# Patient Record
Sex: Female | Born: 1975 | Race: Black or African American | Hispanic: No | Marital: Married | Smoking: Never smoker
Health system: Southern US, Community
[De-identification: ages and names within clinical notes are randomized; demographics above are authoritative.]

## PROBLEM LIST (undated history)

## (undated) DIAGNOSIS — M8580 Other specified disorders of bone density and structure, unspecified site: Secondary | ICD-10-CM

## (undated) DIAGNOSIS — E559 Vitamin D deficiency, unspecified: Secondary | ICD-10-CM

## (undated) DIAGNOSIS — D34 Benign neoplasm of thyroid gland: Secondary | ICD-10-CM

## (undated) DIAGNOSIS — D509 Iron deficiency anemia, unspecified: Secondary | ICD-10-CM

## (undated) DIAGNOSIS — I2542 Coronary artery dissection: Secondary | ICD-10-CM

## (undated) DIAGNOSIS — E079 Disorder of thyroid, unspecified: Secondary | ICD-10-CM

## (undated) DIAGNOSIS — N6009 Solitary cyst of unspecified breast: Secondary | ICD-10-CM

## (undated) HISTORY — DX: Disorder of thyroid, unspecified: E07.9

## (undated) HISTORY — DX: Benign neoplasm of thyroid gland: D34

## (undated) HISTORY — DX: Vitamin D deficiency, unspecified: E55.9

## (undated) HISTORY — DX: Other specified disorders of bone density and structure, unspecified site: M85.80

## (undated) HISTORY — DX: Solitary cyst of unspecified breast: N60.09

## (undated) HISTORY — PX: BREAST SURGERY: SHX581

---

## 2008-07-20 ENCOUNTER — Ambulatory Visit: Payer: Self-pay | Admitting: Family Medicine

## 2012-03-13 LAB — HM COLONOSCOPY

## 2012-07-31 DIAGNOSIS — N6009 Solitary cyst of unspecified breast: Secondary | ICD-10-CM

## 2012-07-31 HISTORY — DX: Solitary cyst of unspecified breast: N60.09

## 2013-01-07 ENCOUNTER — Ambulatory Visit: Payer: Self-pay | Admitting: Family Medicine

## 2013-02-04 ENCOUNTER — Ambulatory Visit (INDEPENDENT_AMBULATORY_CARE_PROVIDER_SITE_OTHER): Payer: 59 | Admitting: General Surgery

## 2013-02-04 ENCOUNTER — Encounter: Payer: Self-pay | Admitting: General Surgery

## 2013-02-04 VITALS — BP 118/80 | HR 82 | Resp 14 | Ht 70.0 in | Wt 148.0 lb

## 2013-02-04 DIAGNOSIS — N6009 Solitary cyst of unspecified breast: Secondary | ICD-10-CM

## 2013-02-04 DIAGNOSIS — N6001 Solitary cyst of right breast: Secondary | ICD-10-CM

## 2013-02-04 NOTE — Progress Notes (Signed)
aPatient ID: Stefanie [name], female   DOB: 22-Feb-1976, 37 y.o.   MRN: [id number]  Chief Complaint  Patient presents with  . Breast Problem    Evaluate breast cyst    HPI Stefanie [name] is a 37 y.o. female who presents for a breast evaluation. The patient complains of a right breast lumps. The patient states she has 2 lumps located in the right lower inner quadrant and 2 lumps in the right upper outer quadrant of the right breast. Both areas are tender to the touch. She states she noticed these lumps right after her miscarriage at [redacted] weeks gestation which occurred in April 2014. The most recent mammogram was done on 01/07/13 with a birad category 2. A right breast ultrasound was done at that time. No personal or family history of breast cancer.   HPI  Past Medical History  Diagnosis Date  . Thyroid disease     hypothyroidism  . Osteopenia   . Breast cyst 2014    History reviewed. No pertinent past surgical history.  History reviewed. No pertinent family history.  Social History History  Substance Use Topics  . Smoking status: Never Smoker   . Smokeless tobacco: Never Used  . Alcohol Use: No    No Known Allergies  Current Outpatient Prescriptions  Medication Sig Dispense Refill  . Alendronate Sodium (BINOSTO) 70 MG TBEF Take 1 tablet by mouth once a week.      . levothyroxine (SYNTHROID, LEVOTHROID) 125 MCG tablet Take 125 mcg by mouth daily.      . Prenatal Vit-Fe Fumarate-FA (PRENATAL MULTIVITAMIN) TABS Take 1 tablet by mouth daily at 12 noon.       No current facility-administered medications for this visit.    Review of Systems Review of Systems  Constitutional: Negative.   Respiratory: Negative.   Cardiovascular: Negative.     Blood pressure 118/80, pulse 82, resp. rate 14, height 5\' 10"  (1.778 m), weight 148 lb (67.132 kg), last menstrual period 01/11/2013.  Physical Exam Physical Exam  Constitutional: She is oriented to person, place, and time. She appears  well-developed and well-nourished.  Neck: No thyromegaly present.  Cardiovascular: Normal rate, regular rhythm and normal heart sounds.   No murmur heard. Pulmonary/Chest: Effort normal and breath sounds normal. Right breast exhibits mass. Right breast exhibits no inverted nipple, no nipple discharge, no skin change and no tenderness. Left breast exhibits no inverted nipple, no mass, no nipple discharge, no skin change and no tenderness. Breasts are symmetrical.  Transverse crease on both nipples.    4 o'clock 1.5cm right breast 5 o'clock 2.5cm right breast  Upper outer quadrant multiple cysts up to 1.5cm of right breast.    Lymphadenopathy:    She has no cervical adenopathy.    She has no axillary adenopathy.  Neurological: She is alert and oriented to person, place, and time.  Skin: Skin is warm and dry.    Data Reviewed January 07, 2013 mammogram showed multiple densities throughout the right breast. Ultrasound completed the same date showed multiple cystic densities. BI-RAD-2.  Assessment    Asymptomatic breast cysts likely related to recent miscarriage.     Plan    The patient is presently pain free. If the lesions in the right breast enlarge, become tender or worrisome, aspiration can be undertaken. As they developed after her recent pregnancy/miscarriage, I would anticipate eventual spontaneous resolution.       Earline Mayotte 02/04/2013, 8:14 PM

## 2013-02-04 NOTE — Patient Instructions (Signed)
Patient to return as needed. She is advised to call if any problems arise.

## 2013-07-17 IMAGING — US ULTRASOUND RIGHT BREAST
1 series · 14 of 25 positions shown · non-contrast
Comparison: none

REASON FOR EXAM: RT BRST LUMP 11 OCLOCK  AND 3 TO HVOMVOP AND BASELINE
COMMENTS:

PROCEDURE:     US  - US BREAST RIGHT  - January 07, 2013  [DATE]
RESULT:     History: Right breast lump.
Comparison Study: No prior.

[Series 1: ultrasound right breast · 0.08mm/px · 14 of 60 slices shown]
[im 1/60]
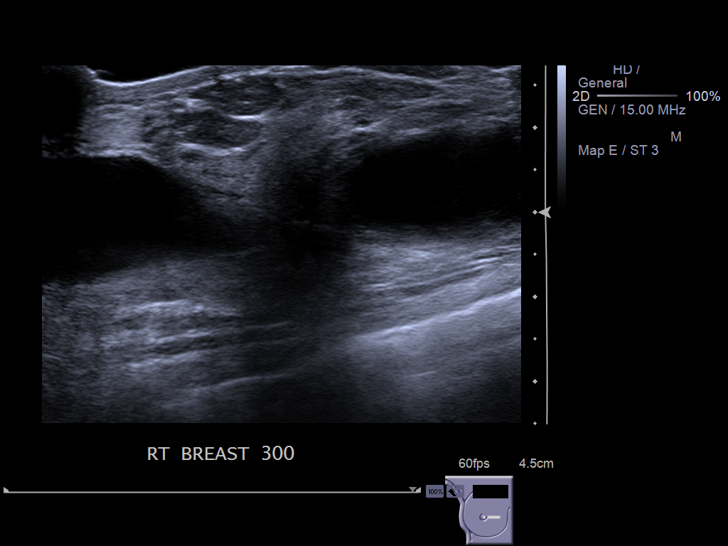
[im 5/60]
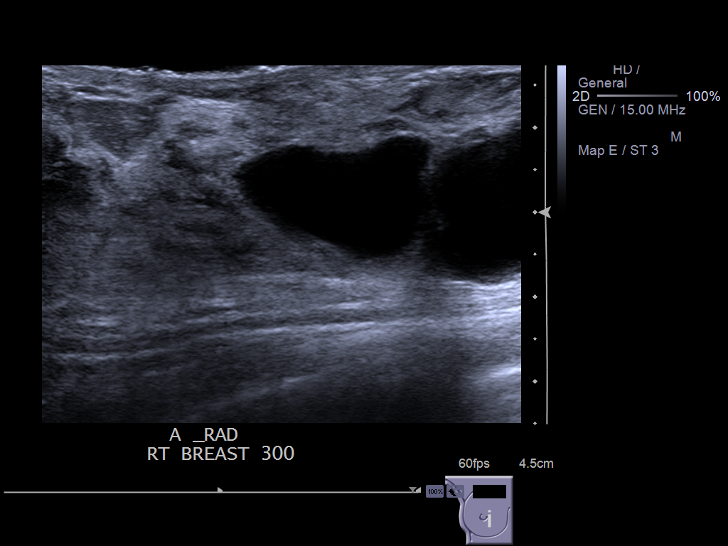
[im 10/60]
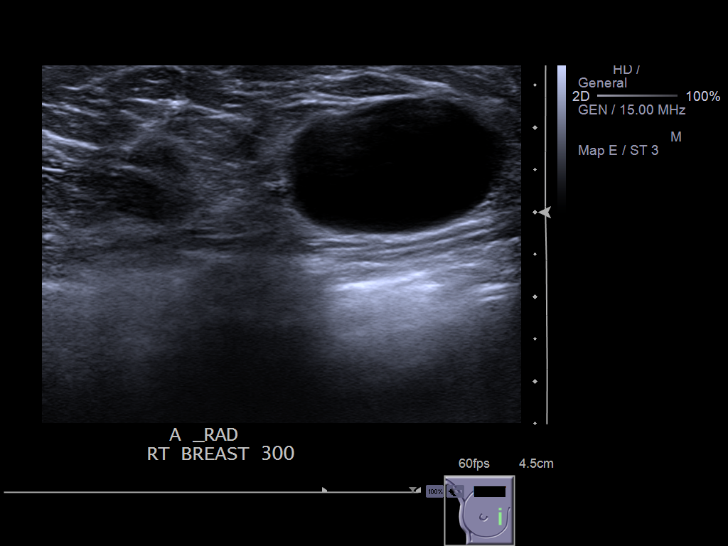
[im 15/60]
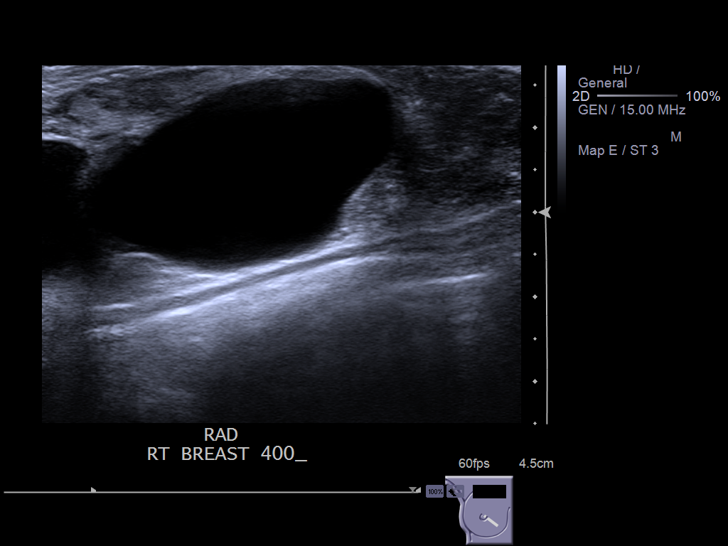
[im 20/60]
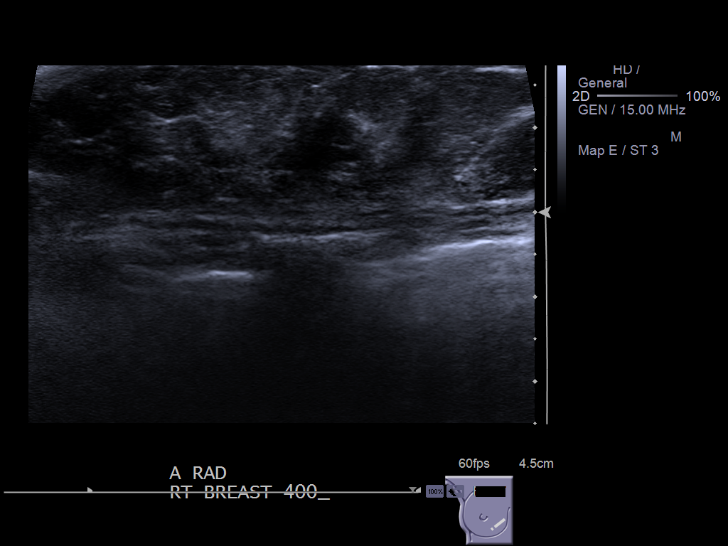
[im 23/60]
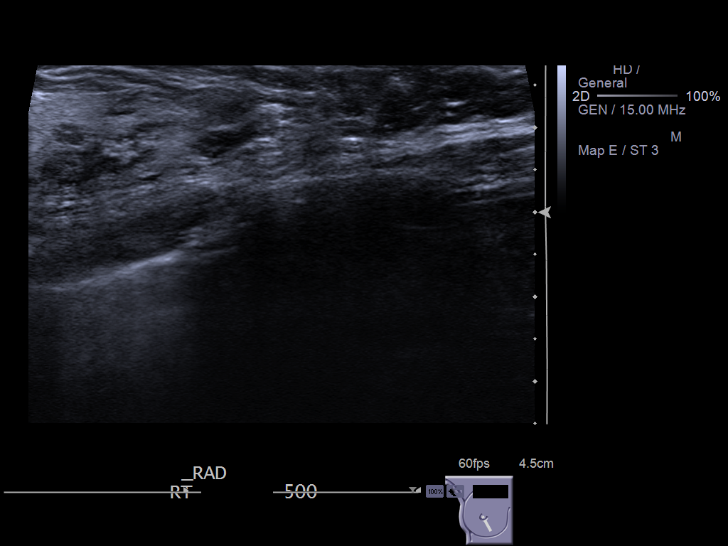
[im 28/60]
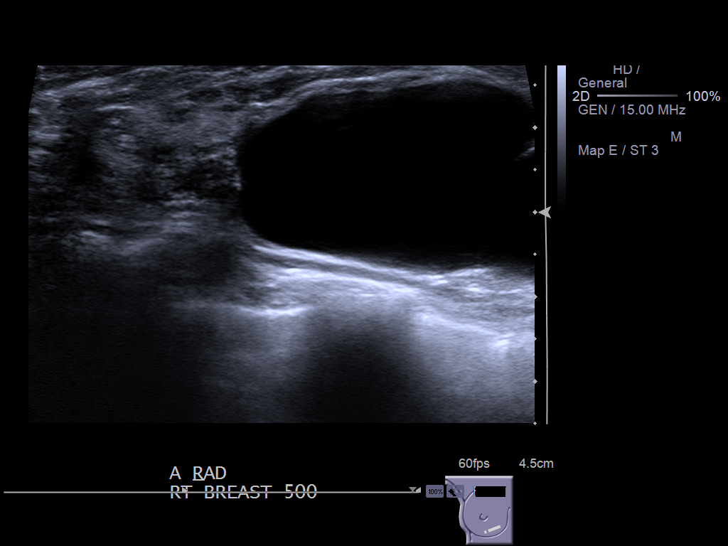
[im 32/60]
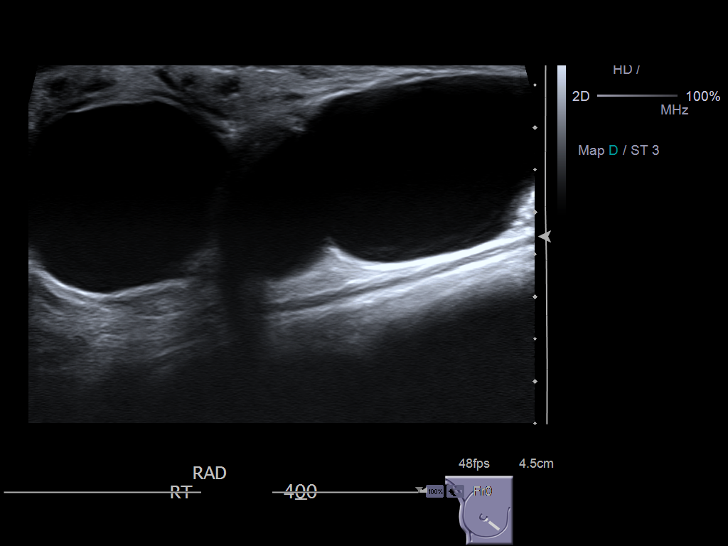
[im 37/60]
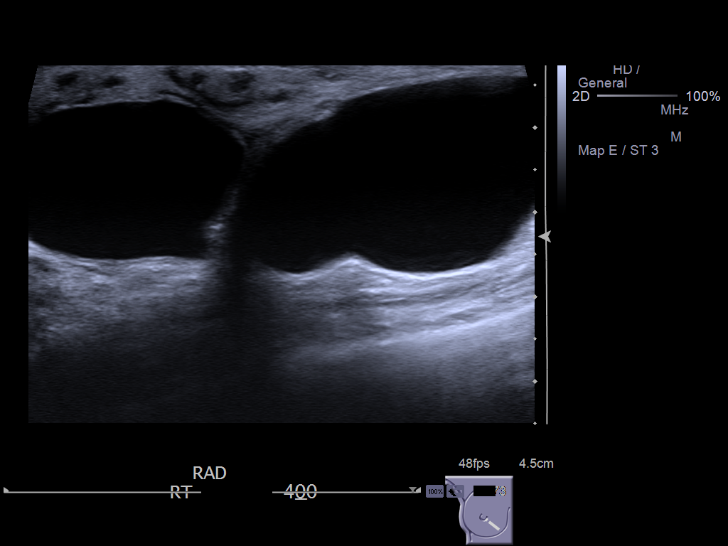
[im 40/60]
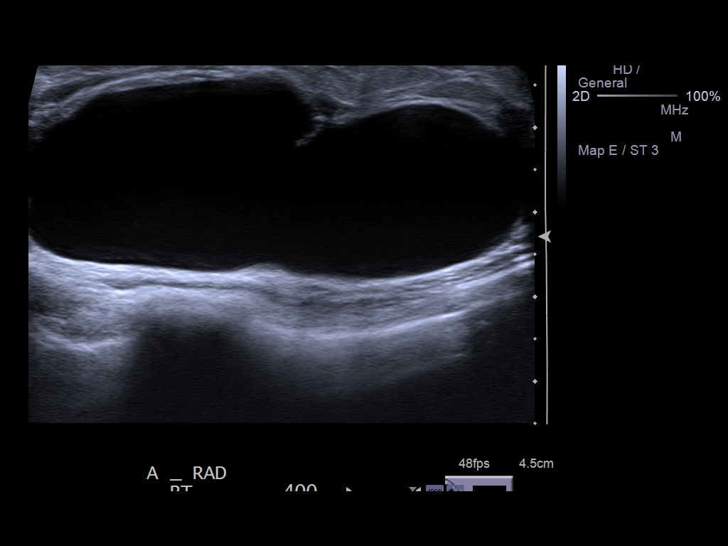
[im 45/60]
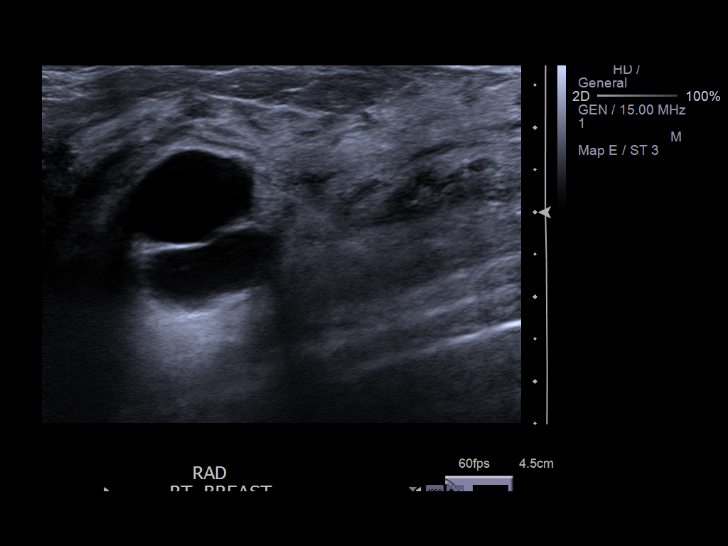
[im 50/60]
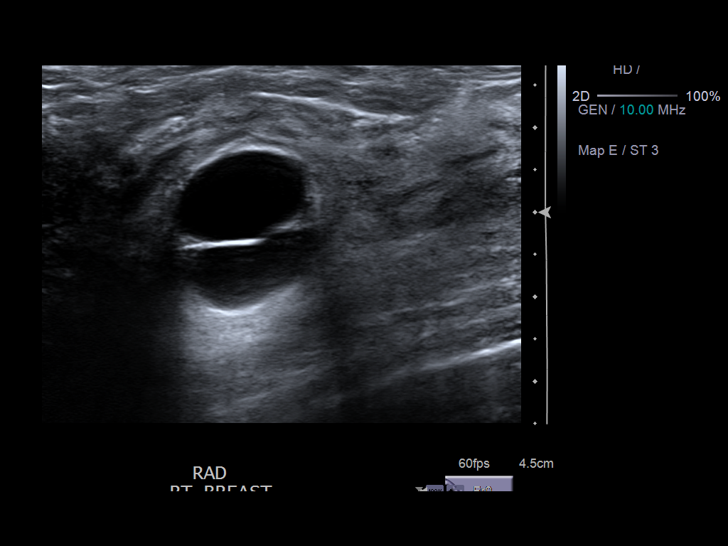
[im 55/60]
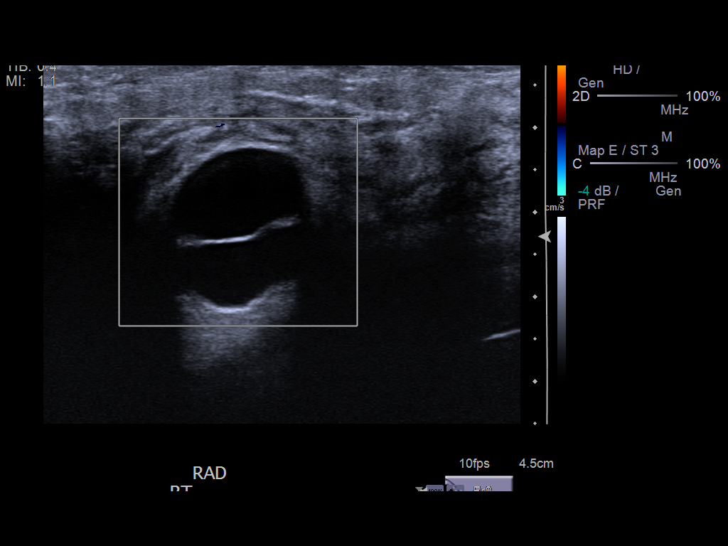
[im 60/60]
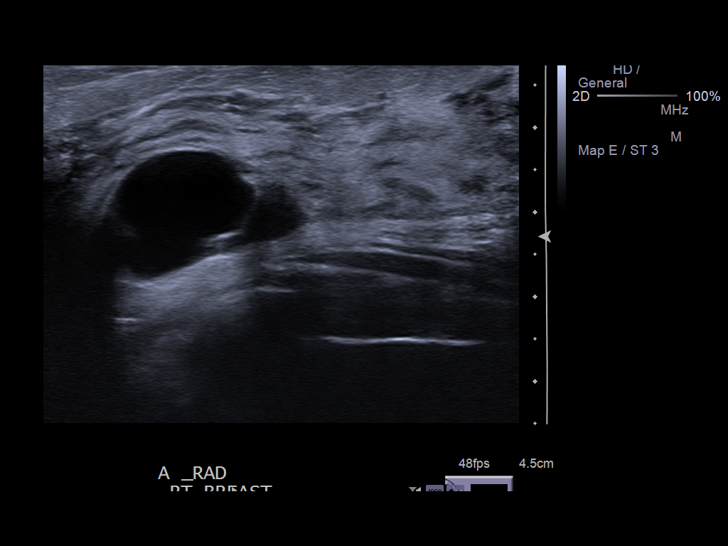

[14 of 25 positions shown; findings below may reference images not displayed]

FINDINGS: Multiple large cysts noted throughout the medial upper portion of
the right breast ,these correspond to mammographic densities. Largest cyst
complex measures 6 cm diameter. No further evaluation needed.
IMPRESSION: Multiple large cysts right breast.

BI-RADS: Category 2- Benign Finding

A NEGATIVE MAMMOGRAM REPORT DOES NOT PRECLUDE BIOPSY OR OTHER EVALUATION OF
A CLINICALLY PALPABLE OR OTHERWISE SUSPICIOUS MASS OR LESION. BREAST CANCER
MAY NOT BE DETECTED IN UP TO 10% OF CASES.

## 2014-01-16 LAB — HM PAP SMEAR: HM PAP: NEGATIVE

## 2014-06-01 ENCOUNTER — Encounter: Payer: Self-pay | Admitting: General Surgery

## 2016-03-07 DIAGNOSIS — D259 Leiomyoma of uterus, unspecified: Secondary | ICD-10-CM | POA: Insufficient documentation

## 2016-11-02 DIAGNOSIS — Z6824 Body mass index (BMI) 24.0-24.9, adult: Secondary | ICD-10-CM | POA: Diagnosis not present

## 2016-11-02 DIAGNOSIS — E0511 Thyrotoxicosis with toxic single thyroid nodule with thyrotoxic crisis or storm: Secondary | ICD-10-CM | POA: Diagnosis not present

## 2016-11-02 DIAGNOSIS — D34 Benign neoplasm of thyroid gland: Secondary | ICD-10-CM | POA: Diagnosis not present

## 2016-11-02 DIAGNOSIS — E890 Postprocedural hypothyroidism: Secondary | ICD-10-CM | POA: Diagnosis not present

## 2016-11-02 DIAGNOSIS — N6001 Solitary cyst of right breast: Secondary | ICD-10-CM | POA: Diagnosis not present

## 2016-11-02 DIAGNOSIS — M810 Age-related osteoporosis without current pathological fracture: Secondary | ICD-10-CM | POA: Diagnosis not present

## 2016-11-03 DIAGNOSIS — M810 Age-related osteoporosis without current pathological fracture: Secondary | ICD-10-CM | POA: Diagnosis not present

## 2016-11-03 DIAGNOSIS — E0511 Thyrotoxicosis with toxic single thyroid nodule with thyrotoxic crisis or storm: Secondary | ICD-10-CM | POA: Diagnosis not present

## 2016-11-03 DIAGNOSIS — E890 Postprocedural hypothyroidism: Secondary | ICD-10-CM | POA: Diagnosis not present

## 2016-11-03 LAB — HM DEXA SCAN

## 2016-11-09 DIAGNOSIS — E0511 Thyrotoxicosis with toxic single thyroid nodule with thyrotoxic crisis or storm: Secondary | ICD-10-CM | POA: Diagnosis not present

## 2016-11-09 DIAGNOSIS — E559 Vitamin D deficiency, unspecified: Secondary | ICD-10-CM | POA: Diagnosis not present

## 2016-11-09 DIAGNOSIS — M810 Age-related osteoporosis without current pathological fracture: Secondary | ICD-10-CM | POA: Diagnosis not present

## 2016-11-24 DIAGNOSIS — N6012 Diffuse cystic mastopathy of left breast: Secondary | ICD-10-CM | POA: Diagnosis not present

## 2016-11-24 DIAGNOSIS — N6001 Solitary cyst of right breast: Secondary | ICD-10-CM | POA: Diagnosis not present

## 2016-11-24 DIAGNOSIS — N6011 Diffuse cystic mastopathy of right breast: Secondary | ICD-10-CM | POA: Diagnosis not present

## 2016-11-24 DIAGNOSIS — N6002 Solitary cyst of left breast: Secondary | ICD-10-CM | POA: Diagnosis not present

## 2016-11-24 LAB — HM MAMMOGRAPHY: HM Mammogram: NORMAL (ref 0–4)

## 2017-02-08 DIAGNOSIS — E890 Postprocedural hypothyroidism: Secondary | ICD-10-CM | POA: Diagnosis not present

## 2017-02-08 DIAGNOSIS — M810 Age-related osteoporosis without current pathological fracture: Secondary | ICD-10-CM | POA: Diagnosis not present

## 2017-02-08 DIAGNOSIS — E0511 Thyrotoxicosis with toxic single thyroid nodule with thyrotoxic crisis or storm: Secondary | ICD-10-CM | POA: Diagnosis not present

## 2017-02-08 DIAGNOSIS — D34 Benign neoplasm of thyroid gland: Secondary | ICD-10-CM | POA: Diagnosis not present

## 2017-02-09 DIAGNOSIS — E559 Vitamin D deficiency, unspecified: Secondary | ICD-10-CM | POA: Diagnosis not present

## 2017-02-09 DIAGNOSIS — E890 Postprocedural hypothyroidism: Secondary | ICD-10-CM | POA: Diagnosis not present

## 2017-02-09 DIAGNOSIS — T50995A Adverse effect of other drugs, medicaments and biological substances, initial encounter: Secondary | ICD-10-CM | POA: Diagnosis not present

## 2017-02-09 DIAGNOSIS — M810 Age-related osteoporosis without current pathological fracture: Secondary | ICD-10-CM | POA: Diagnosis not present

## 2017-02-12 DIAGNOSIS — T50995A Adverse effect of other drugs, medicaments and biological substances, initial encounter: Secondary | ICD-10-CM | POA: Diagnosis not present

## 2017-02-12 DIAGNOSIS — E890 Postprocedural hypothyroidism: Secondary | ICD-10-CM | POA: Diagnosis not present

## 2017-02-14 DIAGNOSIS — Z01419 Encounter for gynecological examination (general) (routine) without abnormal findings: Secondary | ICD-10-CM | POA: Diagnosis not present

## 2017-05-15 DIAGNOSIS — M810 Age-related osteoporosis without current pathological fracture: Secondary | ICD-10-CM | POA: Diagnosis not present

## 2017-05-15 DIAGNOSIS — E890 Postprocedural hypothyroidism: Secondary | ICD-10-CM | POA: Diagnosis not present

## 2017-05-15 DIAGNOSIS — D34 Benign neoplasm of thyroid gland: Secondary | ICD-10-CM | POA: Diagnosis not present

## 2017-05-16 DIAGNOSIS — Z8739 Personal history of other diseases of the musculoskeletal system and connective tissue: Secondary | ICD-10-CM | POA: Insufficient documentation

## 2017-05-16 DIAGNOSIS — Z0000 Encounter for general adult medical examination without abnormal findings: Secondary | ICD-10-CM | POA: Diagnosis not present

## 2017-05-16 DIAGNOSIS — M810 Age-related osteoporosis without current pathological fracture: Secondary | ICD-10-CM | POA: Insufficient documentation

## 2017-05-16 DIAGNOSIS — E041 Nontoxic single thyroid nodule: Secondary | ICD-10-CM | POA: Insufficient documentation

## 2017-05-16 DIAGNOSIS — Z23 Encounter for immunization: Secondary | ICD-10-CM | POA: Diagnosis not present

## 2017-05-22 DIAGNOSIS — M810 Age-related osteoporosis without current pathological fracture: Secondary | ICD-10-CM | POA: Diagnosis not present

## 2017-05-22 DIAGNOSIS — E890 Postprocedural hypothyroidism: Secondary | ICD-10-CM | POA: Diagnosis not present

## 2017-05-22 DIAGNOSIS — D34 Benign neoplasm of thyroid gland: Secondary | ICD-10-CM | POA: Diagnosis not present

## 2017-06-13 DIAGNOSIS — Z0000 Encounter for general adult medical examination without abnormal findings: Secondary | ICD-10-CM | POA: Diagnosis not present

## 2017-07-27 DIAGNOSIS — N3000 Acute cystitis without hematuria: Secondary | ICD-10-CM | POA: Diagnosis not present

## 2017-07-27 DIAGNOSIS — R350 Frequency of micturition: Secondary | ICD-10-CM | POA: Diagnosis not present

## 2017-09-24 ENCOUNTER — Encounter: Payer: Self-pay | Admitting: Family Medicine

## 2017-09-24 ENCOUNTER — Ambulatory Visit: Payer: 59 | Admitting: Family Medicine

## 2017-09-24 VITALS — BP 116/80 | HR 95 | Temp 98.5°F | Resp 16 | Ht 69.69 in | Wt 169.7 lb

## 2017-09-24 DIAGNOSIS — E559 Vitamin D deficiency, unspecified: Secondary | ICD-10-CM | POA: Diagnosis not present

## 2017-09-24 DIAGNOSIS — Z23 Encounter for immunization: Secondary | ICD-10-CM | POA: Diagnosis not present

## 2017-09-24 DIAGNOSIS — K429 Umbilical hernia without obstruction or gangrene: Secondary | ICD-10-CM | POA: Insufficient documentation

## 2017-09-24 DIAGNOSIS — Z114 Encounter for screening for human immunodeficiency virus [HIV]: Secondary | ICD-10-CM

## 2017-09-24 DIAGNOSIS — E039 Hypothyroidism, unspecified: Secondary | ICD-10-CM | POA: Diagnosis not present

## 2017-09-24 NOTE — Progress Notes (Signed)
Name: Stefanie [name]   MRN: [id number]    DOB: January 01, 1976   Date:09/24/2017       Progress Note  Subjective  Chief Complaint  Chief Complaint  Patient presents with  . Establish Care  . Osteoporosis  . Hypothyroidism    HPI  Re-establish; she used to be our patient but moved to [place] and now is back in the area and would like to see a pcp that is closer to home  Hypothyroidism: diagnosed back in 2010. Seeing Dr. Ronnald Collum and has regular US thyroid because of thyroid nodule, she had a biopsy initially but negative. She denies hair loss, constipation, or abrupt change in weight. She gained weight during pregnancy and has not lost all of it, but happy with her weight at this time  Osteoporosis: per patient secondary to hypothyroidism, tried Reclast but had a rash, also try Fosamax but caused indigestion, we will obtain records from Dr. Ronnald Collum   Patient Active Problem List   Diagnosis Date Noted  . Hypothyroidism 09/24/2017  . Umbilical hernia 01/21/7627  . Vitamin D deficiency 09/24/2017  . Osteoporosis 05/16/2017  . Thyroid nodule 05/16/2017  . Uterine leiomyoma 03/07/2016    Past Surgical History:  Procedure Laterality Date  . BREAST SURGERY     left aspiration   . CESAREAN SECTION  07/20/2016    Family History  Problem Relation Age of Onset  . Cancer - Colon Mother 22  . Hyperlipidemia Mother   . Hypertension Mother   . Diabetes Mellitus II Father   . Cancer Maternal Grandmother 20  . Alzheimer's disease Paternal Grandmother     Social History   Socioeconomic History  . Marital status: Married    Spouse name: Amulya Quintin  . Number of children: 1  . Years of education: 4  . Highest education level: Associate degree: academic program  Social Needs  . Financial resource strain: Not hard at all  . Food insecurity - worry: Never true  . Food insecurity - inability: Never true  . Transportation needs - medical: No  . Transportation needs - non-medical:  No  Occupational History    Comment: prn for health unit coordinator   Tobacco Use  . Smoking status: Never Smoker  . Smokeless tobacco: Never Used  Substance and Sexual Activity  . Alcohol use: No  . Drug use: No  . Sexual activity: Yes    Partners: Male    Birth control/protection: None  Other Topics Concern  . Not on file  Social History Narrative   Married since 2015, they have one child    Works part time at [employer]     Current Outpatient Medications:  .  levothyroxine (SYNTHROID, LEVOTHROID) 100 MCG tablet, Take 125 mcg by mouth daily before breakfast. , Disp: , Rfl:  .  Prenatal Vit-Fe Fumarate-FA (PRENATAL MULTIVITAMIN) TABS, Take 1 tablet by mouth daily at 12 noon., Disp: , Rfl:  .  Vitamin D, Ergocalciferol, (DRISDOL) 50000 units CAPS capsule, Take 1 capsule by mouth once a week., Disp: , Rfl: 1  Allergies  Allergen Reactions  . Reclast [Zoledronic Acid]     Rash   . Latex Itching     ROS  Constitutional: Negative for fever , positive for  weight change - since her son's birth in 2017.  Respiratory: Negative for cough and shortness of breath.   Cardiovascular: Negative for chest pain or palpitations.  Gastrointestinal: Negative for abdominal pain, no bowel changes.  Musculoskeletal: Negative for gait problem or joint  swelling.  Skin: Negative for rash.  Neurological: Negative for dizziness or headache.  No other specific complaints in a complete review of systems (except as listed in HPI above).  Objective  Vitals:   09/24/17 1139  BP: 116/80  Pulse: 95  Resp: 16  Temp: 98.5 F (36.9 C)  TempSrc: Oral  SpO2: 98%  Weight: 169 lb 11.2 oz (77 kg)  Height: 5' 9.69" (1.77 m)    Body mass index is 24.57 kg/m.  Physical Exam  Constitutional: Patient appears well-developed and well-nourished. No distress.  HEENT: head atraumatic, normocephalic, pupils equal and reactive to light, neck supple, throat within normal limits Cardiovascular: Normal rate,  regular rhythm and normal heart sounds.  No murmur heard. No BLE edema. Pulmonary/Chest: Effort normal and breath sounds normal. No respiratory distress. Abdominal: Soft.  There is no tenderness. Psychiatric: Patient has a normal mood and affect. behavior is normal. Judgment and thought content normal.  PHQ2/9: Depression screen PHQ 2/9 09/24/2017  Decreased Interest 0  Down, Depressed, Hopeless 0  PHQ - 2 Score 0    Fall Risk: Fall Risk  09/24/2017  Falls in the past year? No    Functional Status Survey: Is the patient deaf or have difficulty hearing?: No Does the patient have difficulty seeing, even when wearing glasses/contacts?: No Does the patient have difficulty concentrating, remembering, or making decisions?: No Does the patient have difficulty walking or climbing stairs?: No Does the patient have difficulty dressing or bathing?: No Does the patient have difficulty doing errands alone such as visiting a doctor's office or shopping?: No    Assessment & Plan  1. Hypothyroidism in adult  We will get records from Dr. Ronnald Collum   2. Flu vaccine need  Up to date   3. Need for Tdap vaccination  Up to date   4. Encounter for screening for HIV  We will look through [facility] chart  5. Vitamin D deficiency  Taking rx vitamin D at this time

## 2017-10-05 ENCOUNTER — Encounter: Payer: Self-pay | Admitting: Family Medicine

## 2017-10-10 ENCOUNTER — Encounter: Payer: Self-pay | Admitting: Family Medicine

## 2017-10-10 ENCOUNTER — Ambulatory Visit: Payer: 59 | Admitting: Family Medicine

## 2017-10-10 ENCOUNTER — Other Ambulatory Visit: Payer: Self-pay | Admitting: Family Medicine

## 2017-10-10 VITALS — BP 126/72 | HR 90 | Temp 98.0°F | Resp 18 | Ht 69.0 in | Wt 172.6 lb

## 2017-10-10 DIAGNOSIS — E559 Vitamin D deficiency, unspecified: Secondary | ICD-10-CM

## 2017-10-10 DIAGNOSIS — Z3201 Encounter for pregnancy test, result positive: Secondary | ICD-10-CM | POA: Diagnosis not present

## 2017-10-10 DIAGNOSIS — E039 Hypothyroidism, unspecified: Secondary | ICD-10-CM | POA: Diagnosis not present

## 2017-10-10 LAB — POCT URINE PREGNANCY: PREG TEST UR: POSITIVE — AB

## 2017-10-10 NOTE — Progress Notes (Signed)
Name: Stefanie [name]   MRN: [id number]    DOB: 1976/02/28   Date:10/10/2017       Progress Note  Subjective  Chief Complaint  Chief Complaint  Patient presents with  . Possible Pregnancy    Last menstrual cycle was August 31, 2017    HPI   Positive pregnancy test: she did a test at home and it was positive, she has a 11 month old at home. She does not have any symptoms. She states she found out because cycle was late. LMP 08/31/2017 EDD 06/07/2018  Vitamin D deficiency: she was given high dose by Dr. Ronnald Collum, we will recheck level if above 20, just take otc supplementation  Hypothyroidism: explained that thyroid levels are affected during pregnancy, we will recheck TSH  Patient Active Problem List   Diagnosis Date Noted  . Hypothyroidism 09/24/2017  . Umbilical hernia 09/98/3382  . Vitamin D deficiency 09/24/2017  . Osteoporosis 05/16/2017  . Thyroid nodule 05/16/2017  . Uterine leiomyoma 03/07/2016    Past Surgical History:  Procedure Laterality Date  . BREAST SURGERY     left aspiration   . CESAREAN SECTION  07/20/2016    Family History  Problem Relation Age of Onset  . Cancer - Colon Mother 2  . Hyperlipidemia Mother   . Hypertension Mother   . Diabetes Mellitus II Father   . Cancer Maternal Grandmother 65  . Alzheimer's disease Paternal Grandmother     Social History   Socioeconomic History  . Marital status: Married    Spouse name: Junnie Loschiavo  . Number of children: 1  . Years of education: 33  . Highest education level: Associate degree: academic program  Social Needs  . Financial resource strain: Not hard at all  . Food insecurity - worry: Never true  . Food insecurity - inability: Never true  . Transportation needs - medical: No  . Transportation needs - non-medical: No  Occupational History    Comment: prn for health unit coordinator   Tobacco Use  . Smoking status: Never Smoker  . Smokeless tobacco: Never Used  Substance and Sexual  Activity  . Alcohol use: No  . Drug use: No  . Sexual activity: Yes    Partners: Male    Birth control/protection: None  Other Topics Concern  . Not on file  Social History Narrative   Married since 2015, they have one child    Works part time at [employer]     Current Outpatient Medications:  .  Cholecalciferol 5000 units TABS, Take 1 tablet by mouth daily., Disp: , Rfl:  .  folic acid (FOLVITE) 505 MCG tablet, Take 400 mcg by mouth daily., Disp: , Rfl:  .  levothyroxine (SYNTHROID, LEVOTHROID) 100 MCG tablet, Take 100 mcg by mouth daily before breakfast. , Disp: , Rfl:  .  Prenatal Vit-Fe Fumarate-FA (PRENATAL MULTIVITAMIN) TABS, Take 1 tablet by mouth daily at 12 noon., Disp: , Rfl:  .  Vitamin D, Ergocalciferol, (DRISDOL) 50000 units CAPS capsule, Take 1 capsule by mouth once a week., Disp: , Rfl: 1  Allergies  Allergen Reactions  . Reclast [Zoledronic Acid]     Rash   . Latex Itching     ROS  Ten systems reviewed and is negative except as mentioned in HPI   Objective  Vitals:   10/10/17 1531  BP: 126/72  Pulse: 90  Resp: 18  Temp: 98 F (36.7 C)  TempSrc: Oral  SpO2: 99%  Weight: 172 lb 9.6 oz (78.3  kg)  Height: 5\' 9"  (1.753 m)    Body mass index is 25.49 kg/m.  Physical Exam  Constitutional: Patient appears well-developed and well-nourished. No distress.  HEENT: head atraumatic, normocephalic, pupils equal and reactive to light,  neck supple, throat within normal limits Cardiovascular: Normal rate, regular rhythm and normal heart sounds.  No murmur heard. No BLE edema. Pulmonary/Chest: Effort normal and breath sounds normal. No respiratory distress. Abdominal: Soft.  There is no tenderness. Psychiatric: Patient has a normal mood and affect. behavior is normal. Judgment and thought content normal.  Recent Results (from the past 2160 hour(s))  POCT urine pregnancy     Status: Abnormal   Collection Time: 10/10/17  3:34 PM  Result Value Ref Range   Preg  Test, Ur Positive (A) Negative     PHQ2/9: Depression screen PHQ 2/9 09/24/2017  Decreased Interest 0  Down, Depressed, Hopeless 0  PHQ - 2 Score 0    Fall Risk: Fall Risk  10/10/2017 09/24/2017  Falls in the past year? No No    Functional Status Survey: Is the patient deaf or have difficulty hearing?: No Does the patient have difficulty seeing, even when wearing glasses/contacts?: No Does the patient have difficulty concentrating, remembering, or making decisions?: No Does the patient have difficulty walking or climbing stairs?: No Does the patient have difficulty dressing or bathing?: No Does the patient have difficulty doing errands alone such as visiting a doctor's office or shopping?: No   Assessment & Plan  1. Pregnancy test performed, pregnancy confirmed  - POCT urine pregnancy  2. Hypothyroidism in adult  - TSH  3. Vitamin D deficiency  - VITAMIN D 25 Hydroxy (Vit-D Deficiency, Fractures)

## 2017-10-11 LAB — TSH: TSH: 1.63 m[IU]/L

## 2017-10-11 LAB — VITAMIN D 25 HYDROXY (VIT D DEFICIENCY, FRACTURES): VIT D 25 HYDROXY: 47 ng/mL (ref 30–100)

## 2017-10-12 ENCOUNTER — Encounter: Payer: Self-pay | Admitting: Family Medicine

## 2017-10-29 DIAGNOSIS — N912 Amenorrhea, unspecified: Secondary | ICD-10-CM | POA: Diagnosis not present

## 2017-10-29 DIAGNOSIS — Z3201 Encounter for pregnancy test, result positive: Secondary | ICD-10-CM | POA: Diagnosis not present

## 2017-10-31 DIAGNOSIS — Z3201 Encounter for pregnancy test, result positive: Secondary | ICD-10-CM | POA: Diagnosis not present

## 2017-11-06 DIAGNOSIS — O09511 Supervision of elderly primigravida, first trimester: Secondary | ICD-10-CM | POA: Diagnosis not present

## 2017-11-06 DIAGNOSIS — O039 Complete or unspecified spontaneous abortion without complication: Secondary | ICD-10-CM | POA: Diagnosis not present

## 2017-11-06 DIAGNOSIS — Z3A08 8 weeks gestation of pregnancy: Secondary | ICD-10-CM | POA: Diagnosis not present

## 2017-11-06 DIAGNOSIS — O26891 Other specified pregnancy related conditions, first trimester: Secondary | ICD-10-CM | POA: Diagnosis not present

## 2017-11-06 DIAGNOSIS — O208 Other hemorrhage in early pregnancy: Secondary | ICD-10-CM | POA: Diagnosis not present

## 2017-11-06 DIAGNOSIS — R102 Pelvic and perineal pain: Secondary | ICD-10-CM | POA: Diagnosis not present

## 2017-11-14 DIAGNOSIS — E559 Vitamin D deficiency, unspecified: Secondary | ICD-10-CM | POA: Diagnosis not present

## 2017-11-14 DIAGNOSIS — M810 Age-related osteoporosis without current pathological fracture: Secondary | ICD-10-CM | POA: Diagnosis not present

## 2017-11-14 DIAGNOSIS — E0511 Thyrotoxicosis with toxic single thyroid nodule with thyrotoxic crisis or storm: Secondary | ICD-10-CM | POA: Diagnosis not present

## 2017-11-14 DIAGNOSIS — E890 Postprocedural hypothyroidism: Secondary | ICD-10-CM | POA: Diagnosis not present

## 2017-11-14 DIAGNOSIS — D34 Benign neoplasm of thyroid gland: Secondary | ICD-10-CM | POA: Diagnosis not present

## 2017-11-19 DIAGNOSIS — Z3201 Encounter for pregnancy test, result positive: Secondary | ICD-10-CM | POA: Diagnosis not present

## 2017-11-21 DIAGNOSIS — E0511 Thyrotoxicosis with toxic single thyroid nodule with thyrotoxic crisis or storm: Secondary | ICD-10-CM | POA: Diagnosis not present

## 2017-11-21 DIAGNOSIS — D34 Benign neoplasm of thyroid gland: Secondary | ICD-10-CM | POA: Diagnosis not present

## 2017-11-21 DIAGNOSIS — M810 Age-related osteoporosis without current pathological fracture: Secondary | ICD-10-CM | POA: Diagnosis not present

## 2018-02-25 DIAGNOSIS — H40003 Preglaucoma, unspecified, bilateral: Secondary | ICD-10-CM | POA: Diagnosis not present

## 2018-03-06 DIAGNOSIS — Z1231 Encounter for screening mammogram for malignant neoplasm of breast: Secondary | ICD-10-CM | POA: Diagnosis not present

## 2018-03-06 LAB — HM MAMMOGRAPHY

## 2018-03-25 DIAGNOSIS — E559 Vitamin D deficiency, unspecified: Secondary | ICD-10-CM

## 2018-03-25 DIAGNOSIS — D34 Benign neoplasm of thyroid gland: Secondary | ICD-10-CM | POA: Insufficient documentation

## 2018-03-25 HISTORY — DX: Vitamin D deficiency, unspecified: E55.9

## 2018-03-28 ENCOUNTER — Encounter: Payer: Self-pay | Admitting: Family Medicine

## 2018-05-15 DIAGNOSIS — M810 Age-related osteoporosis without current pathological fracture: Secondary | ICD-10-CM | POA: Diagnosis not present

## 2018-05-15 DIAGNOSIS — E0511 Thyrotoxicosis with toxic single thyroid nodule with thyrotoxic crisis or storm: Secondary | ICD-10-CM | POA: Diagnosis not present

## 2018-05-15 DIAGNOSIS — D34 Benign neoplasm of thyroid gland: Secondary | ICD-10-CM | POA: Diagnosis not present

## 2018-05-15 DIAGNOSIS — E890 Postprocedural hypothyroidism: Secondary | ICD-10-CM | POA: Diagnosis not present

## 2018-05-22 DIAGNOSIS — E890 Postprocedural hypothyroidism: Secondary | ICD-10-CM | POA: Diagnosis not present

## 2018-05-22 DIAGNOSIS — D34 Benign neoplasm of thyroid gland: Secondary | ICD-10-CM | POA: Diagnosis not present

## 2018-05-22 DIAGNOSIS — E559 Vitamin D deficiency, unspecified: Secondary | ICD-10-CM | POA: Diagnosis not present

## 2018-05-22 DIAGNOSIS — E0511 Thyrotoxicosis with toxic single thyroid nodule with thyrotoxic crisis or storm: Secondary | ICD-10-CM | POA: Diagnosis not present

## 2018-05-27 ENCOUNTER — Encounter: Payer: Self-pay | Admitting: Family Medicine

## 2018-06-03 ENCOUNTER — Other Ambulatory Visit (HOSPITAL_COMMUNITY)
Admission: RE | Admit: 2018-06-03 | Discharge: 2018-06-03 | Disposition: A | Discharge status: Home / Self Care | Payer: 59 | Source: Ambulatory Visit | Attending: Family Medicine | Admitting: Family Medicine

## 2018-06-03 ENCOUNTER — Encounter: Payer: Self-pay | Admitting: Family Medicine

## 2018-06-03 ENCOUNTER — Ambulatory Visit (INDEPENDENT_AMBULATORY_CARE_PROVIDER_SITE_OTHER): Payer: 59 | Admitting: Family Medicine

## 2018-06-03 VITALS — BP 118/68 | HR 69 | Temp 97.6°F | Resp 16 | Ht 69.0 in | Wt 166.1 lb

## 2018-06-03 DIAGNOSIS — Z800 Family history of malignant neoplasm of digestive organs: Secondary | ICD-10-CM | POA: Insufficient documentation

## 2018-06-03 DIAGNOSIS — Z124 Encounter for screening for malignant neoplasm of cervix: Secondary | ICD-10-CM

## 2018-06-03 DIAGNOSIS — Z13220 Encounter for screening for lipoid disorders: Secondary | ICD-10-CM

## 2018-06-03 DIAGNOSIS — E039 Hypothyroidism, unspecified: Secondary | ICD-10-CM | POA: Diagnosis not present

## 2018-06-03 DIAGNOSIS — M818 Other osteoporosis without current pathological fracture: Secondary | ICD-10-CM | POA: Diagnosis not present

## 2018-06-03 DIAGNOSIS — Z01419 Encounter for gynecological examination (general) (routine) without abnormal findings: Secondary | ICD-10-CM

## 2018-06-03 DIAGNOSIS — E559 Vitamin D deficiency, unspecified: Secondary | ICD-10-CM

## 2018-06-03 DIAGNOSIS — Z131 Encounter for screening for diabetes mellitus: Secondary | ICD-10-CM

## 2018-06-03 DIAGNOSIS — N63 Unspecified lump in unspecified breast: Secondary | ICD-10-CM | POA: Insufficient documentation

## 2018-06-03 DIAGNOSIS — Z1211 Encounter for screening for malignant neoplasm of colon: Secondary | ICD-10-CM

## 2018-06-03 NOTE — Patient Instructions (Signed)
Preventive Care 18-39 Years, Female Preventive care refers to lifestyle choices and visits with your health care provider that can promote health and wellness. What does preventive care include?  A yearly physical exam. This is also called an annual well check.  Dental exams once or twice a year.  Routine eye exams. Ask your health care provider how often you should have your eyes checked.  Personal lifestyle choices, including: ? Daily care of your teeth and gums. ? Regular physical activity. ? Eating a healthy diet. ? Avoiding tobacco and drug use. ? Limiting alcohol use. ? Practicing safe sex. ? Taking vitamin and mineral supplements as recommended by your health care provider. What happens during an annual well check? The services and screenings done by your health care provider during your annual well check will depend on your age, overall health, lifestyle risk factors, and family history of disease. Counseling Your health care provider may ask you questions about your:  Alcohol use.  Tobacco use.  Drug use.  Emotional well-being.  Home and relationship well-being.  Sexual activity.  Eating habits.  Work and work Statistician.  Method of birth control.  Menstrual cycle.  Pregnancy history.  Screening You may have the following tests or measurements:  Height, weight, and BMI.  Diabetes screening. This is done by checking your blood sugar (glucose) after you have not eaten for a while (fasting).  Blood pressure.  Lipid and cholesterol levels. These may be checked every 5 years starting at age 66.  Skin check.  Hepatitis C blood test.  Hepatitis B blood test.  Sexually transmitted disease (STD) testing.  BRCA-related cancer screening. This may be done if you have a family history of breast, ovarian, tubal, or peritoneal cancers.  Pelvic exam and Pap test. This may be done every 3 years starting at age 40. Starting at age 59, this may be done every 5  years if you have a Pap test in combination with an HPV test.  Discuss your test results, treatment options, and if necessary, the need for more tests with your health care provider. Vaccines Your health care provider may recommend certain vaccines, such as:  Influenza vaccine. This is recommended every year.  Tetanus, diphtheria, and acellular pertussis (Tdap, Td) vaccine. You may need a Td booster every 10 years.  Varicella vaccine. You may need this if you have not been vaccinated.  HPV vaccine. If you are 69 or younger, you may need three doses over 6 months.  Measles, mumps, and rubella (MMR) vaccine. You may need at least one dose of MMR. You may also need a second dose.  Pneumococcal 13-valent conjugate (PCV13) vaccine. You may need this if you have certain conditions and were not previously vaccinated.  Pneumococcal polysaccharide (PPSV23) vaccine. You may need one or two doses if you smoke cigarettes or if you have certain conditions.  Meningococcal vaccine. One dose is recommended if you are age 27-21 years and a first-year college student living in a residence hall, or if you have one of several medical conditions. You may also need additional booster doses.  Hepatitis A vaccine. You may need this if you have certain conditions or if you travel or work in places where you may be exposed to hepatitis A.  Hepatitis B vaccine. You may need this if you have certain conditions or if you travel or work in places where you may be exposed to hepatitis B.  Haemophilus influenzae type b (Hib) vaccine. You may need this if  you have certain risk factors.  Talk to your health care provider about which screenings and vaccines you need and how often you need them. This information is not intended to replace advice given to you by your health care provider. Make sure you discuss any questions you have with your health care provider. Document Released: 09/12/2001 Document Revised: 04/05/2016  Document Reviewed: 05/18/2015 Elsevier Interactive Patient Education  [employer].

## 2018-06-03 NOTE — Progress Notes (Signed)
Name: Stefanie [name]   MRN: [id number]    DOB: [birth year]   Date:06/03/2018       Progress Note  Subjective  Chief Complaint  Chief Complaint  Patient presents with  . Annual Exam    patient sleeps about 7hrs/night and tries to eat a well balanced diet.    HPI   Patient presents for annual CPE   Diet: eats a balanced diet, rich in fruit and vegetables,and fish. She is pescatarian  Exercise: not physically active   USPSTF grade A and B recommendations    Office Visit from 06/03/2018 in [facility]  AUDIT-C Score  0     Depression:  Depression screen [facility] 2/9 06/03/2018 09/24/2017  Decreased Interest 0 0  Down, Depressed, Hopeless 0 0  PHQ - 2 Score 0 0  Altered sleeping 0 -  Tired, decreased energy 0 -  Change in appetite 0 -  Feeling bad or failure about yourself  0 -  Trouble concentrating 0 -  Moving slowly or fidgety/restless 0 -  Suicidal thoughts 0 -  PHQ-9 Score 0 -  Difficult doing work/chores Not difficult at all -   Hypertension: BP Readings from Last 3 Encounters:  06/03/18 118/68  10/10/17 126/72  09/24/17 116/80   Obesity: Wt Readings from Last 3 Encounters:  06/03/18 166 lb 1.6 oz (75.3 kg)  10/10/17 172 lb 9.6 oz (78.3 kg)  09/24/17 169 lb 11.2 oz (77 kg)   BMI Readings from Last 3 Encounters:  06/03/18 24.53 kg/m  10/10/17 25.49 kg/m  09/24/17 24.57 kg/m    Hep C Screening: up to date  STD testing and prevention (HIV/chl/gon/syphilis): done when pregnant in 2017 Intimate partner violence: negative screen  Sexual History/Pain during Intercourse: no pain  Menstrual History/LMP/Abnormal Bleeding: cycles are regular, lasts 3 days Incontinence Symptoms: no incontinence  Advanced Care Planning: A voluntary discussion about advance care planning including the explanation and discussion of advance directives.  Discussed health care proxy and Living will, and the patient was able to identify a health care proxy as husband .   Patient does not have a living will at present time. If patient does have living will, I have requested they bring this to the clinic to be scanned in to their chart.  Breast cancer:  HM Mammogram  Date Value Ref Range Status  11/24/2016 Self Reported Normal 0-4 Bi-Rad, Self Reported Normal Final    Comment:    Report Chart-[facility] Cyst-Diagnostic Studies done-repeat 1 year     BRCA gene screening: N/A Cervical cancer screening: today  Osteoporosis Screening:  HM Dexa Scan  Date Value Ref Range Status  11/03/2016 osteoprosis  Final    Comment:    scanned   Skin cancer: discussed atypical lesions  Colorectal cancer: mother had colon cancer at age 76, patient had colonoscopy a few years ago for rectal bleeding that was hemorrhoids we will obtain records  Patient Active Problem List   Diagnosis Date Noted  . 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 Vitamin D deficiency 03/25/2018  . Hypothyroidism 09/24/2017  . Umbilical hernia 67/59/1638  . Vitamin D deficiency 09/24/2017  . Osteoporosis 05/16/2017  . Thyroid nodule 05/16/2017  . Uterine leiomyoma 03/07/2016    Past Surgical History:  Procedure Laterality Date  . BREAST SURGERY     left aspiration   . CESAREAN SECTION  07/20/2016    Family History  Problem Relation Age of Onset  . Cancer - Colon Mother 26  .  Hyperlipidemia Mother   . Hypertension Mother   . Diabetes Mellitus II Father   . Cancer Maternal Grandmother 3  . Alzheimer's disease Paternal Grandmother     Social History   Socioeconomic History  . Marital status: Married    Spouse name: Brisia Schuermann  . Number of children: 1  . Years of education: 36  . Highest education level: Associate degree: academic program  Occupational History    Comment: prn for health unit coordinator   Social Needs  . Financial resource strain: Not hard at all  . Food insecurity:    Worry: Never true    Inability: Never true  . Transportation needs:    Medical:  No    Non-medical: No  Tobacco Use  . Smoking status: Never Smoker  . Smokeless tobacco: Never Used  Substance and Sexual Activity  . Alcohol use: No  . Drug use: No  . Sexual activity: Yes    Partners: Male    Birth control/protection: None  Lifestyle  . Physical activity:    Days per week: 0 days    Minutes per session: 0 min  . Stress: Not at all  Relationships  . Social connections:    Talks on phone: More than three times a week    Gets together: More than three times a week    Attends religious service: More than 4 times per year    Active member of club or organization: Yes    Attends meetings of clubs or organizations: More than 4 times per year    Relationship status: Married  . Intimate partner violence:    Fear of current or ex partner: No    Emotionally abused: No    Physically abused: No    Forced sexual activity: No  Other Topics Concern  . Not on file  Social History Narrative   Married since 2015, they have one child born 07/20/2016   Works part time at [employer]     Current Outpatient Medications:  .  levothyroxine (SYNTHROID, LEVOTHROID) 100 MCG tablet, Take 100 mcg by mouth daily before breakfast. , Disp: , Rfl:  .  Prenatal Vit-Fe Fumarate-FA (PRENATAL MULTIVITAMIN) TABS, Take 1 tablet by mouth daily at 12 noon., Disp: , Rfl:  .  Vitamin D, Ergocalciferol, (DRISDOL) 50000 units CAPS capsule, Take 1 capsule by mouth once a week., Disp: , Rfl: 1  Allergies  Allergen Reactions  . Fosamax [Alendronate]     stomachache  . Reclast [Zoledronic Acid]     Rash   . Latex Itching     ROS  Constitutional: Negative for fever or weight change.  Respiratory: Negative for cough and shortness of breath.   Cardiovascular: Negative for chest pain or palpitations.  Gastrointestinal: Negative for abdominal pain, no bowel changes.  Musculoskeletal: Negative for gait problem or joint swelling.  Skin: Negative for rash.  Neurological: Negative for dizziness or  headache.  No other specific complaints in a complete review of systems (except as listed in HPI above).  Objective  Vitals:   06/03/18 1335  BP: 118/68  Pulse: 69  Resp: 16  Temp: 97.6 F (36.4 C)  TempSrc: Oral  SpO2: 99%  Weight: 166 lb 1.6 oz (75.3 kg)  Height: '5\' 9"'  (1.753 m)    Body mass index is 24.53 kg/m.  Physical Exam  Constitutional: Patient appears well-developed and well-nourished. No distress.  HENT: Head: Normocephalic and atraumatic. Ears: B TMs ok, no erythema or effusion; Nose: Nose normal. Mouth/Throat: Oropharynx  is clear and moist. No oropharyngeal exudate.  Eyes: Conjunctivae and EOM are normal. Pupils are equal, round, and reactive to light. No scleral icterus.  Neck: Normal range of motion. Neck supple. No JVD present. No thyromegaly present.  Cardiovascular: Normal rate, regular rhythm and normal heart sounds.  No murmur heard. No BLE edema. Pulmonary/Chest: Effort normal and breath sounds normal. No respiratory distress. Abdominal: Soft. Bowel sounds are normal, no distension. There is no tenderness. no masses Breast: left lower out quadrant breast lump, gets monitored by surgeon in [place] and history of aspiration, currently not causing any problems, we will obtain copy of mammogram  FEMALE GENITALIA:  External genitalia normal External urethra normal Vaginal vault normal without discharge or lesions Cervix normal without discharge or lesions Bimanual exam normal without masses RECTAL: not done Musculoskeletal: Normal range of motion, no joint effusions. No gross deformities Neurological: he is alert and oriented to person, place, and time. No cranial nerve deficit. Coordination, balance, strength, speech and gait are normal.  Skin: Skin is warm and dry. No rash noted. No erythema.  Psychiatric: Patient has a normal mood and affect. behavior is normal. Judgment and thought content normal.   PHQ2/9: Depression screen [facility] 2/9 06/03/2018 09/24/2017   Decreased Interest 0 0  Down, Depressed, Hopeless 0 0  PHQ - 2 Score 0 0  Altered sleeping 0 -  Tired, decreased energy 0 -  Change in appetite 0 -  Feeling bad or failure about yourself  0 -  Trouble concentrating 0 -  Moving slowly or fidgety/restless 0 -  Suicidal thoughts 0 -  PHQ-9 Score 0 -  Difficult doing work/chores Not difficult at all -     Fall Risk: Fall Risk  06/03/2018 10/10/2017 09/24/2017  Falls in the past year? 0 No No     Functional Status Survey: Is the patient deaf or have difficulty hearing?: No Does the patient have difficulty seeing, even when wearing glasses/contacts?: No Does the patient have difficulty concentrating, remembering, or making decisions?: No Does the patient have difficulty walking or climbing stairs?: No Does the patient have difficulty dressing or bathing?: No Does the patient have difficulty doing errands alone such as visiting a doctor's office or shopping?: No   Assessment & Plan  1. Well woman exam  Labs ordered   2. Hypothyroidism in adult  Under the care of Dr. Ronnald Collum  3. Other osteoporosis without current pathological fracture  Tried fosamax orally and caused upset stomach, tried Reclast and had a rash on her arm   4. Vitamin D deficiency  On vitamin D daily   5. Cervical cancer screening  - Cytology - PAP  6. Diabetes mellitus screening  - Hemoglobin A1c  7. Lipid screening  - Lipid panel  8. Breast lump in lower-outer quadrant  Keep follow up with surgeon   9. Family history of colon cancer in mother  - Ambulatory referral to Gastroenterology  10. Colon cancer screening  - Ambulatory referral to Gastroenterology  -USPSTF grade A and B recommendations reviewed with patient; age-appropriate recommendations, preventive care, screening tests, etc discussed and encouraged; healthy living encouraged; see AVS for patient education given to patient -Discussed importance of 150 minutes of physical  activity weekly, eat two servings of fish weekly, eat one serving of tree nuts ( cashews, pistachios, pecans, almonds.Marland Kitchen) every other day, eat 6 servings of fruit/vegetables daily and drink plenty of water and avoid sweet beverages.

## 2018-06-04 LAB — COMPLETE METABOLIC PANEL WITH GFR
AG Ratio: 1.4 (calc) (ref 1.0–2.5)
ALT: 9 U/L (ref 6–29)
AST: 14 U/L (ref 10–30)
Albumin: 4 g/dL (ref 3.6–5.1)
Alkaline phosphatase (APISO): 73 U/L (ref 33–115)
BILIRUBIN TOTAL: 0.4 mg/dL (ref 0.2–1.2)
BUN: 9 mg/dL (ref 7–25)
CALCIUM: 9.9 mg/dL (ref 8.6–10.2)
CO2: 33 mmol/L — AB (ref 20–32)
CREATININE: 0.63 mg/dL (ref 0.50–1.10)
Chloride: 101 mmol/L (ref 98–110)
GFR, EST AFRICAN AMERICAN: 126 mL/min/{1.73_m2} (ref >=60)
GFR, EST NON AFRICAN AMERICAN: 109 mL/min/{1.73_m2} (ref >=60)
GLUCOSE: 84 mg/dL (ref 65–139)
Globulin: 2.9 g/dL (calc) (ref 1.9–3.7)
Potassium: 3.9 mmol/L (ref 3.5–5.3)
Sodium: 138 mmol/L (ref 135–146)
TOTAL PROTEIN: 6.9 g/dL (ref 6.1–8.1)

## 2018-06-04 LAB — LIPID PANEL
CHOL/HDL RATIO: 3.4 (calc) (ref <5.0)
CHOLESTEROL: 171 mg/dL (ref <200)
HDL: 50 mg/dL — ABNORMAL LOW (ref >50)
LDL Cholesterol (Calc): 96 mg/dL (calc)
Non-HDL Cholesterol (Calc): 121 mg/dL (calc) (ref <130)
Triglycerides: 150 mg/dL — ABNORMAL HIGH (ref <150)

## 2018-06-04 LAB — CBC WITH DIFFERENTIAL/PLATELET
BASOS PCT: 0.5 %
Basophils Absolute: 28 cells/uL (ref 0–200)
EOS ABS: 62 {cells}/uL (ref 15–500)
Eosinophils Relative: 1.1 %
HCT: 37.6 % (ref 35.0–45.0)
HEMOGLOBIN: 12.2 g/dL (ref 11.7–15.5)
Lymphs Abs: 1546 cells/uL (ref 850–3900)
MCH: 29.1 pg (ref 27.0–33.0)
MCHC: 32.4 g/dL (ref 32.0–36.0)
MCV: 89.7 fL (ref 80.0–100.0)
MONOS PCT: 7.9 %
MPV: 11.8 fL (ref 7.5–12.5)
NEUTROS ABS: 3522 {cells}/uL (ref 1500–7800)
Neutrophils Relative %: 62.9 %
Platelets: 293 10*3/uL (ref 140–400)
RBC: 4.19 10*6/uL (ref 3.80–5.10)
RDW: 11.8 % (ref 11.0–15.0)
Total Lymphocyte: 27.6 %
WBC mixed population: 442 cells/uL (ref 200–950)
WBC: 5.6 10*3/uL (ref 3.8–10.8)

## 2018-06-04 LAB — HEMOGLOBIN A1C
HEMOGLOBIN A1C: 5.5 %{Hb} (ref <5.7)
Mean Plasma Glucose: 111 (calc)
eAG (mmol/L): 6.2 (calc)

## 2018-06-05 LAB — CYTOLOGY - PAP
DIAGNOSIS: NEGATIVE
HPV: NOT DETECTED

## 2018-06-17 ENCOUNTER — Encounter: Payer: Self-pay | Admitting: *Deleted

## 2018-09-23 DIAGNOSIS — O4690 Antepartum hemorrhage, unspecified, unspecified trimester: Secondary | ICD-10-CM | POA: Diagnosis not present

## 2018-09-23 DIAGNOSIS — E039 Hypothyroidism, unspecified: Secondary | ICD-10-CM | POA: Diagnosis not present

## 2018-09-23 DIAGNOSIS — O039 Complete or unspecified spontaneous abortion without complication: Secondary | ICD-10-CM | POA: Diagnosis not present

## 2018-09-23 DIAGNOSIS — Z98891 History of uterine scar from previous surgery: Secondary | ICD-10-CM | POA: Insufficient documentation

## 2018-09-25 DIAGNOSIS — O200 Threatened abortion: Secondary | ICD-10-CM | POA: Diagnosis not present

## 2018-10-03 DIAGNOSIS — O200 Threatened abortion: Secondary | ICD-10-CM | POA: Diagnosis not present

## 2018-10-08 DIAGNOSIS — O200 Threatened abortion: Secondary | ICD-10-CM | POA: Diagnosis not present

## 2018-11-15 LAB — HM DEXA SCAN

## 2018-11-22 ENCOUNTER — Encounter: Payer: Self-pay | Admitting: Family Medicine

## 2019-03-21 LAB — HM MAMMOGRAPHY

## 2019-05-22 LAB — TSH: TSH: 1.24 (ref 0.41–5.90)

## 2019-05-22 LAB — LIPID PANEL
Cholesterol: 150 (ref 0–200)
HDL: 50 (ref 35–70)
LDL Cholesterol: 89
LDl/HDL Ratio: 1.8
Triglycerides: 49 (ref 40–160)

## 2019-05-22 LAB — BASIC METABOLIC PANEL
BUN: 9 (ref 4–21)
Chloride: 103 (ref 99–108)
Creatinine: 0.7 (ref 0.5–1.1)
Glucose: 91
Potassium: 4.2 (ref 3.4–5.3)
Sodium: 137 (ref 137–147)

## 2019-05-22 LAB — VITAMIN D 25 HYDROXY (VIT D DEFICIENCY, FRACTURES): Vit D, 25-Hydroxy: 67.2

## 2019-05-22 LAB — CBC AND DIFFERENTIAL
HCT: 36 (ref 36–46)
Hemoglobin: 11.6 — AB (ref 12.0–16.0)
Neutrophils Absolute: 2
Platelets: 255 (ref 150–399)
WBC: 4.8

## 2019-05-22 LAB — HEPATIC FUNCTION PANEL
ALT: 11 (ref 7–35)
AST: 12 — AB (ref 13–35)

## 2019-05-22 LAB — CBC: RBC: 3.98 (ref 3.87–5.11)

## 2019-05-22 LAB — COMPREHENSIVE METABOLIC PANEL: Calcium: 9.3 (ref 8.7–10.7)

## 2019-06-02 ENCOUNTER — Encounter: Payer: Self-pay | Admitting: Family Medicine

## 2019-06-09 ENCOUNTER — Other Ambulatory Visit: Payer: Self-pay

## 2019-06-09 ENCOUNTER — Encounter: Payer: Self-pay | Admitting: Family Medicine

## 2019-06-09 ENCOUNTER — Other Ambulatory Visit (HOSPITAL_COMMUNITY)
Admission: RE | Admit: 2019-06-09 | Discharge: 2019-06-09 | Disposition: A | Discharge status: Home / Self Care | Payer: 59 | Source: Ambulatory Visit | Attending: Family Medicine | Admitting: Family Medicine

## 2019-06-09 ENCOUNTER — Ambulatory Visit (INDEPENDENT_AMBULATORY_CARE_PROVIDER_SITE_OTHER): Payer: 59 | Admitting: Family Medicine

## 2019-06-09 VITALS — BP 114/68 | HR 85 | Temp 97.5°F | Resp 16 | Ht 69.0 in | Wt 167.1 lb

## 2019-06-09 DIAGNOSIS — Z1211 Encounter for screening for malignant neoplasm of colon: Secondary | ICD-10-CM | POA: Diagnosis not present

## 2019-06-09 DIAGNOSIS — Z113 Encounter for screening for infections with a predominantly sexual mode of transmission: Secondary | ICD-10-CM

## 2019-06-09 DIAGNOSIS — Z0000 Encounter for general adult medical examination without abnormal findings: Secondary | ICD-10-CM | POA: Diagnosis not present

## 2019-06-09 DIAGNOSIS — N6001 Solitary cyst of right breast: Secondary | ICD-10-CM | POA: Diagnosis not present

## 2019-06-09 DIAGNOSIS — Z131 Encounter for screening for diabetes mellitus: Secondary | ICD-10-CM

## 2019-06-09 DIAGNOSIS — N6002 Solitary cyst of left breast: Secondary | ICD-10-CM

## 2019-06-09 NOTE — Patient Instructions (Signed)
Please contact insurance and ask about colonoscopy, new guidelines from USPTF recommend colon cancer screen starting for everyone at age 45   Preventive Care 45-77 Years Old, Female Preventive care refers to visits with your health care provider and lifestyle choices that can promote health and wellness. This includes:  A yearly physical exam. This may also be called an annual well check.  Regular dental visits and eye exams.  Immunizations.  Screening for certain conditions.  Healthy lifestyle choices, such as eating a healthy diet, getting regular exercise, not using drugs or products that contain nicotine and tobacco, and limiting alcohol use. What can I expect for my preventive care visit? Physical exam Your health care provider will check your:  Height and weight. This may be used to calculate body mass index (BMI), which tells if you are at a healthy weight.  Heart rate and blood pressure.  Skin for abnormal spots. Counseling Your health care provider may ask you questions about your:  Alcohol, tobacco, and drug use.  Emotional well-being.  Home and relationship well-being.  Sexual activity.  Eating habits.  Work and work Statistician.  Method of birth control.  Menstrual cycle.  Pregnancy history. What immunizations do I need?  Influenza (flu) vaccine  This is recommended every year. Tetanus, diphtheria, and pertussis (Tdap) vaccine  You may need a Td booster every 10 years. Varicella (chickenpox) vaccine  You may need this if you have not been vaccinated. Zoster (shingles) vaccine  You may need this after age 47. Measles, mumps, and rubella (MMR) vaccine  You may need at least one dose of MMR if you were born in 1957 or later. You may also need a second dose. Pneumococcal conjugate (PCV13) vaccine  You may need this if you have certain conditions and were not previously vaccinated. Pneumococcal polysaccharide (PPSV23) vaccine  You may need one  or two doses if you smoke cigarettes or if you have certain conditions. Meningococcal conjugate (MenACWY) vaccine  You may need this if you have certain conditions. Hepatitis A vaccine  You may need this if you have certain conditions or if you travel or work in places where you may be exposed to hepatitis A. Hepatitis B vaccine  You may need this if you have certain conditions or if you travel or work in places where you may be exposed to hepatitis B. Haemophilus influenzae type b (Hib) vaccine  You may need this if you have certain conditions. Human papillomavirus (HPV) vaccine  If recommended by your health care provider, you may need three doses over 6 months. You may receive vaccines as individual doses or as more than one vaccine together in one shot (combination vaccines). Talk with your health care provider about the risks and benefits of combination vaccines. What tests do I need? Blood tests  Lipid and cholesterol levels. These may be checked every 5 years, or more frequently if you are over 102 years old.  Hepatitis C test.  Hepatitis B test. Screening  Lung cancer screening. You may have this screening every year starting at age 45 if you have a 30-pack-year history of smoking and currently smoke or have quit within the past 15 years.  Colorectal cancer screening. All adults should have this screening starting at age 45 and continuing until age 55. Your health care provider may recommend screening at age 40 if you are at increased risk. You will have tests every 1-10 years, depending on your results and the type of screening test.  Diabetes  screening. This is done by checking your blood sugar (glucose) after you have not eaten for a while (fasting). You may have this done every 1-3 years.  Mammogram. This may be done every 1-2 years. Talk with your health care provider about when you should start having regular mammograms. This may depend on whether you have a family  history of breast cancer.  BRCA-related cancer screening. This may be done if you have a family history of breast, ovarian, tubal, or peritoneal cancers.  Pelvic exam and Pap test. This may be done every 3 years starting at age 45. Starting at age 45, this may be done every 5 years if you have a Pap test in combination with an HPV test. Other tests  Sexually transmitted disease (STD) testing.  Bone density scan. This is done to screen for osteoporosis. You may have this scan if you are at high risk for osteoporosis. Follow these instructions at home: Eating and drinking  Eat a diet that includes fresh fruits and vegetables, whole grains, lean protein, and low-fat dairy.  Take vitamin and mineral supplements as recommended by your health care provider.  Do not drink alcohol if: ? Your health care provider tells you not to drink. ? You are pregnant, may be pregnant, or are planning to become pregnant.  If you drink alcohol: ? Limit how much you have to 0-1 drink a day. ? Be aware of how much alcohol is in your drink. In the [place] one drink equals one 12 oz bottle of beer (355 mL), one 5 oz glass of wine (148 mL), or one 1 oz glass of hard liquor (44 mL). Lifestyle  Take daily care of your teeth and gums.  Stay active. Exercise for at least 30 minutes on 5 or more days each week.  Do not use any products that contain nicotine or tobacco, such as cigarettes, e-cigarettes, and chewing tobacco. If you need help quitting, ask your health care provider.  If you are sexually active, practice safe sex. Use a condom or other form of birth control (contraception) in order to prevent pregnancy and STIs (sexually transmitted infections).  If told by your health care provider, take low-dose aspirin daily starting at age 45. What's next?  Visit your health care provider once a year for a well check visit.  Ask your health care provider how often you should have your eyes and teeth checked.   Stay up to date on all vaccines. This information is not intended to replace advice given to you by your health care provider. Make sure you discuss any questions you have with your health care provider. Document Released: 08/13/2015 Document Revised: 03/28/2018 Document Reviewed: 03/28/2018 Elsevier Patient Education  2020 [employer].

## 2019-06-09 NOTE — Progress Notes (Signed)
Name: Stefanie [name]   MRN: [id number]    DOB: [birth year]   Date:06/09/2019       Progress Note  Subjective  Chief Complaint  Chief Complaint  Patient presents with  . Annual Exam    HPI  Patient presents for annual CPE.  Diet: balanced, fruit, vegetables, fish - lots of vegetarian meals since husband is vegetarian  Exercise: doing home exercises 3 times a week at home   USPSTF grade A and B recommendations    Office Visit from 06/03/2018 in [facility]  AUDIT-C Score  0     Depression: Phq 9 is  negative Depression screen [facility] 2/9 06/09/2019 06/03/2018 09/24/2017  Decreased Interest 0 0 0  Down, Depressed, Hopeless 0 0 0  PHQ - 2 Score 0 0 0  Altered sleeping 0 0 -  Tired, decreased energy 0 0 -  Change in appetite 0 0 -  Feeling bad or failure about yourself  0 0 -  Trouble concentrating 0 0 -  Moving slowly or fidgety/restless 0 0 -  Suicidal thoughts 0 0 -  PHQ-9 Score 0 0 -  Difficult doing work/chores Not difficult at all Not difficult at all -   Hypertension: BP Readings from Last 3 Encounters:  06/09/19 114/68  06/03/18 118/68  10/10/17 126/72   Obesity: Wt Readings from Last 3 Encounters:  06/09/19 167 lb 1.6 oz (75.8 kg)  06/03/18 166 lb 1.6 oz (75.3 kg)  10/10/17 172 lb 9.6 oz (78.3 kg)   BMI Readings from Last 3 Encounters:  06/09/19 24.68 kg/m  06/03/18 24.53 kg/m  10/10/17 25.49 kg/m     Hep C Screening: up to date  STD testing and prevention (HIV/chl/gon/syphilis): she is willing to have it done  Intimate partner violence: negative screen  Sexual History/Pain during Intercourse: no concerns Menstrual History/LMP/Abnormal Bleeding: regular, LMP started on 06/07/2019  Incontinence Symptoms: improving, very seldom now has stress incontinence  Breast cancer:  - Last Mammogram: 03/2019 - BRCA gene screening: N/A  Osteoporosis: discussed high calcium and vitamin D supplementation - she had recent bone density and only  showed osteopenia   Cervical cancer screening: up to date   Skin cancer: discussed atypical lesions  Colorectal cancer: referral placed   Advanced Care Planning: A voluntary discussion about advance care planning including the explanation and discussion of advance directives.  Discussed health care proxy and Living will, and the patient was able to identify a health care proxy as husband   Patient does not have a living will at present time.   Lipids: Lab Results  Component Value Date   CHOL 171 06/03/2018   Lab Results  Component Value Date   HDL 50 (L) 06/03/2018   Lab Results  Component Value Date   LDLCALC 96 06/03/2018   Lab Results  Component Value Date   TRIG 150 (H) 06/03/2018   Lab Results  Component Value Date   CHOLHDL 3.4 06/03/2018   No results found for: LDLDIRECT  Glucose: Glucose, Bld  Date Value Ref Range Status  06/03/2018 84 65 - 139 mg/dL Final    Comment:    .        Non-fasting reference interval .     Patient Active Problem List   Diagnosis Date Noted  . History of cesarean delivery 09/23/2018  . Breast lump in lower-outer quadrant 06/03/2018  . Family history of colon cancer in mother 06/03/2018  . 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 Vitamin  D deficiency 03/25/2018  . Hypothyroidism 09/24/2017  . Umbilical hernia 33/29/5188  . Vitamin D deficiency 09/24/2017  . Osteoporosis 05/16/2017  . Thyroid nodule 05/16/2017  . Uterine leiomyoma 03/07/2016    Past Surgical History:  Procedure Laterality Date  . BREAST SURGERY     left aspiration   . CESAREAN SECTION  07/20/2016    Family History  Problem Relation Age of Onset  . Cancer - Colon Mother 91  . Hyperlipidemia Mother   . Hypertension Mother   . Diabetes Mellitus II Father   . Cancer Maternal Grandmother 27  . Alzheimer's disease Paternal Grandmother     Social History   Socioeconomic History  . Marital status: Married    Spouse name: Kristien Salatino  .  Number of children: 1  . Years of education: 53  . Highest education level: Associate degree: academic program  Occupational History    Comment: prn for health unit coordinator   Social Needs  . Financial resource strain: Not hard at all  . Food insecurity    Worry: Never true    Inability: Never true  . Transportation needs    Medical: No    Non-medical: No  Tobacco Use  . Smoking status: Never Smoker  . Smokeless tobacco: Never Used  Substance and Sexual Activity  . Alcohol use: No  . Drug use: No  . Sexual activity: Yes    Partners: Male    Birth control/protection: None  Lifestyle  . Physical activity    Days per week: 3 days    Minutes per session: 60 min  . Stress: Not at all  Relationships  . Social connections    Talks on phone: More than three times a week    Gets together: More than three times a week    Attends religious service: More than 4 times per year    Active member of club or organization: Yes    Attends meetings of clubs or organizations: More than 4 times per year    Relationship status: Married  . Intimate partner violence    Fear of current or ex partner: No    Emotionally abused: No    Physically abused: No    Forced sexual activity: No  Other Topics Concern  . Not on file  Social History Narrative   Married since 2015, they have one child born 07/20/2016   Works part time at [employer]     Current Outpatient Medications:  .  levothyroxine (SYNTHROID, LEVOTHROID) 100 MCG tablet, Take 100 mcg by mouth daily before breakfast. , Disp: , Rfl:  .  Prenatal Vit-Fe Fumarate-FA (PRENATAL MULTIVITAMIN) TABS, Take 1 tablet by mouth daily at 12 noon., Disp: , Rfl:  .  Vitamin D, Ergocalciferol, (DRISDOL) 50000 units CAPS capsule, Take 1 capsule by mouth once a week., Disp: , Rfl: 1  Allergies  Allergen Reactions  . Fosamax [Alendronate]     stomachache  . Reclast [Zoledronic Acid]     Rash   . Latex Itching     ROS  Constitutional: Negative for  fever or weight change.  Respiratory: Negative for cough and shortness of breath.   Cardiovascular: Negative for chest pain or palpitations.  Gastrointestinal: Negative for abdominal pain, no bowel changes.  Musculoskeletal: Negative for gait problem or joint swelling.  Skin: Negative for rash.  Neurological: Negative for dizziness or headache.  No other specific complaints in a complete review of systems (except as listed in HPI above).  Objective  Vitals:  06/09/19 1340  BP: 114/68  Pulse: 85  Resp: 16  Temp: (!) 97.5 F (36.4 C)  TempSrc: Temporal  SpO2: 97%  Weight: 167 lb 1.6 oz (75.8 kg)  Height: '5\' 9"'  (1.753 m)    Body mass index is 24.68 kg/m.  Physical Exam  Constitutional: Patient appears well-developed and well-nourished. No distress.  HENT: Head: Normocephalic and atraumatic. Ears: B TMs ok, no erythema or effusion; nose and oral mucosa not done  Eyes: Conjunctivae and EOM are normal. Pupils are equal, round, and reactive to light. No scleral icterus.  Neck: Normal range of motion. Neck supple. No JVD present. No thyromegaly present.  Cardiovascular: Normal rate, regular rhythm and normal heart sounds.  No murmur heard. No BLE edema. Pulmonary/Chest: Effort normal and breath sounds normal. No respiratory distress. Abdominal: Soft. Bowel sounds are normal, no distension. There is no tenderness. no masses Breast: large round mass on left breast, 3'clock she will contact surgeon, mammogram diagnostic showed likely benign, long history of breast lumps and cysts, some lumpy tissue on right upper quadrant also  FEMALE GENITALIA:  Pelvic not done  RECTAL:not done  Musculoskeletal: Normal range of motion, no joint effusions. No gross deformities Neurological: he is alert and oriented to person, place, and time. No cranial nerve deficit. Coordination, balance, strength, speech and gait are normal.  Skin: Skin is warm and dry. No rash noted. No erythema.  Psychiatric:  Patient has a normal mood and affect. behavior is normal. Judgment and thought content normal.  Recent Results (from the past 2160 hour(s))  HM MAMMOGRAPHY     Status: None   Collection Time: 03/21/19 12:00 AM  Result Value Ref Range   HM Mammogram 0-4 Bi-Rad 0-4 Bi-Rad, Self Reported Normal    Comment: No evidence of Malignancy is seen in the left breast.      Fall Risk: Fall Risk  06/09/2019 06/03/2018 10/10/2017 09/24/2017  Falls in the past year? 0 0 No No  Number falls in past yr: 0 - - -  Injury with Fall? 0 - - -     Functional Status Survey: Is the patient deaf or have difficulty hearing?: No Does the patient have difficulty seeing, even when wearing glasses/contacts?: No Does the patient have difficulty concentrating, remembering, or making decisions?: No Does the patient have difficulty walking or climbing stairs?: No Does the patient have difficulty dressing or bathing?: No Does the patient have difficulty doing errands alone such as visiting a doctor's office or shopping?: No   Assessment & Plan  1. Well adult exam   2. Colon cancer screening  - Ambulatory referral to Gastroenterology  3. Bilateral breast cysts  She sees a Psychologist, sport and exercise in [place] and she will call back to re-schedule   4. Routine screening for STI (sexually transmitted infection)  - HIV Antibody (routine testing w rflx) - RPR - Cervicovaginal ancillary only  5. Screening for diabetes mellitus  - Hemoglobin A1c  -USPSTF grade A and B recommendations reviewed with patient; age-appropriate recommendations, preventive care, screening tests, etc discussed and encouraged; healthy living encouraged; see AVS for patient education given to patient -Discussed importance of 150 minutes of physical activity weekly, eat two servings of fish weekly, eat one serving of tree nuts ( cashews, pistachios, pecans, almonds.Marland Kitchen) every other day, eat 6 servings of fruit/vegetables daily and drink plenty of water and  avoid sweet beverages.

## 2019-06-10 LAB — HEMOGLOBIN A1C
Hgb A1c MFr Bld: 5.5 % of total Hgb (ref <5.7)
Mean Plasma Glucose: 111 (calc)
eAG (mmol/L): 6.2 (calc)

## 2019-06-10 LAB — HIV ANTIBODY (ROUTINE TESTING W REFLEX): HIV 1&2 Ab, 4th Generation: NONREACTIVE

## 2019-06-10 LAB — RPR: RPR Ser Ql: NONREACTIVE

## 2019-06-11 LAB — CERVICOVAGINAL ANCILLARY ONLY
Chlamydia: NEGATIVE
Comment: NEGATIVE
Comment: NORMAL
Neisseria Gonorrhea: NEGATIVE

## 2019-06-20 ENCOUNTER — Encounter: Payer: Self-pay | Admitting: *Deleted

## 2019-10-23 ENCOUNTER — Encounter: Payer: Self-pay | Admitting: Family Medicine

## 2019-10-23 ENCOUNTER — Other Ambulatory Visit: Payer: Self-pay | Admitting: Family Medicine

## 2019-10-30 ENCOUNTER — Ambulatory Visit (INDEPENDENT_AMBULATORY_CARE_PROVIDER_SITE_OTHER): Payer: Self-pay | Admitting: Gastroenterology

## 2019-10-30 VITALS — Ht 70.0 in | Wt 166.0 lb

## 2019-10-30 DIAGNOSIS — Z1211 Encounter for screening for malignant neoplasm of colon: Secondary | ICD-10-CM

## 2019-10-30 DIAGNOSIS — Z800 Family history of malignant neoplasm of digestive organs: Secondary | ICD-10-CM

## 2019-10-30 MED ORDER — NA SULFATE-K SULFATE-MG SULF 17.5-3.13-1.6 GM/177ML PO SOLN: 1.0000 | Freq: Once | ORAL | 0 refills | Status: AC

## 2019-10-30 NOTE — Progress Notes (Signed)
Gastroenterology Pre-Procedure Review  Request Date: Thursday 11/20/19 Requesting Physician: Dr. Vicente Males  PATIENT REVIEW QUESTIONS: The patient responded to the following health history questions as indicated:    1. Are you having any GI issues? no 2. Do you have a personal history of Polyps? no 3. Do you have a family history of Colon Cancer or Polyps? yes (MOTHER COLON CANCER) 4. Diabetes Mellitus? no 5. Joint replacements in the past 12 months?no 6. Major health problems in the past 3 months?no 7. Any artificial heart valves, MVP, or defibrillator?no    MEDICATIONS & ALLERGIES:    Patient reports the following regarding taking any anticoagulation/antiplatelet therapy:   Plavix, Coumadin, Eliquis, Xarelto, Lovenox, Pradaxa, Brilinta, or Effient? no Aspirin? no  Patient confirms/reports the following medications:  Current Outpatient Medications  Medication Sig Dispense Refill  . ferrous sulfate 325 (65 FE) MG tablet ferrous sulfate 325 mg (65 mg iron) tablet  TAKE 1 TABLET BY MOUTH ONCE A DAY    . levothyroxine (SYNTHROID, LEVOTHROID) 100 MCG tablet Take 100 mcg by mouth daily before breakfast.     . lidocaine (LIDODERM) 5 % SMARTSIG:1 Patch(s) Topical Every 12 Hours    . Na Sulfate-K Sulfate-Mg Sulf 17.5-3.13-1.6 GM/177ML SOLN Take 1 kit by mouth once for 1 dose. 354 mL 0  . naproxen (NAPROSYN) 500 MG tablet Take 500 mg by mouth 2 (two) times daily.    . Prenatal Vit-Fe Fumarate-FA (PRENATAL MULTIVITAMIN) TABS Take 1 tablet by mouth daily at 12 noon.    . Vitamin D, Ergocalciferol, (DRISDOL) 50000 units CAPS capsule Take 1 capsule by mouth once a week.  1   No current facility-administered medications for this visit.    Patient confirms/reports the following allergies:  Allergies  Allergen Reactions  . Fosamax [Alendronate]     stomachache  . Reclast [Zoledronic Acid]     Rash   . Latex Itching    No orders of the defined types were placed in this  encounter.   AUTHORIZATION INFORMATION Primary Insurance: 1D#: Group #:  Secondary Insurance: 1D#: Group #:  SCHEDULE INFORMATION: Date: Thursday 11/20/19 Time: Location:[facility]

## 2019-11-05 ENCOUNTER — Telehealth: Payer: Self-pay

## 2019-11-05 NOTE — Telephone Encounter (Signed)
Copied from [place] [phone]. Topic: Referral - Question >> Nov 05, 2019  3:08 PM Loma Boston wrote: Reason for CRM: Pt states that she had ask Dr Ancil Boozer to send a referral to [facility] for her mammogram and  was sent but it read as order for Bilateral, [place] states it needs to read as Mammogram for Right/Diagnostic. Please resend .

## 2019-11-10 ENCOUNTER — Telehealth: Payer: Self-pay | Admitting: Gastroenterology

## 2019-11-10 NOTE — Telephone Encounter (Signed)
Patient called & l/m on v/m to have someone call her she has questions about her colonoscopy on 11-20-2019.Please call

## 2019-11-11 NOTE — Telephone Encounter (Signed)
Returned patients call.  She has requested to reschedule her colonoscopy date from 04/22 due to her menstrual cycle.  Colonoscopy has been rescheduled to Thursday 11/27/19.  Endoscopy has been notified of date change.  Thanks,  [place], [place]

## 2019-11-20 LAB — HM MAMMOGRAPHY: HM Mammogram: NORMAL (ref 0–4)

## 2019-11-21 ENCOUNTER — Encounter: Payer: Self-pay | Admitting: Family Medicine

## 2019-11-21 ENCOUNTER — Telehealth: Payer: Self-pay

## 2019-11-21 NOTE — Telephone Encounter (Signed)
Patients colonoscopy has been canceled for 11/27/19 with Dr. Vicente Males because she just found out she is pregnant.  Trish in Endo has been notified.  Thanks,  [place], [place]

## 2019-11-27 ENCOUNTER — Ambulatory Visit: Admit: 2019-11-27 | Payer: 59 | Admitting: Gastroenterology

## 2019-11-27 SURGERY — COLONOSCOPY WITH PROPOFOL
Anesthesia: General

## 2020-01-28 ENCOUNTER — Ambulatory Visit: Payer: 59

## 2020-02-09 ENCOUNTER — Encounter (HOSPITAL_COMMUNITY)
Admission: EM | Disposition: A | Discharge status: Home / Self Care | Payer: Self-pay | Source: Home / Self Care | Attending: Cardiovascular Disease

## 2020-02-09 ENCOUNTER — Encounter (HOSPITAL_COMMUNITY): Payer: Self-pay

## 2020-02-09 ENCOUNTER — Ambulatory Visit: Payer: Self-pay

## 2020-02-09 ENCOUNTER — Other Ambulatory Visit: Payer: Self-pay

## 2020-02-09 ENCOUNTER — Emergency Department (HOSPITAL_COMMUNITY): Payer: 59

## 2020-02-09 ENCOUNTER — Inpatient Hospital Stay (HOSPITAL_COMMUNITY)
Admission: EM | Admit: 2020-02-09 | Discharge: 2020-02-12 | DRG: 280 | Disposition: A | Discharge status: Home / Self Care | Payer: 59 | Attending: Cardiovascular Disease | Admitting: Cardiovascular Disease

## 2020-02-09 DIAGNOSIS — Z20822 Contact with and (suspected) exposure to covid-19: Secondary | ICD-10-CM | POA: Diagnosis present

## 2020-02-09 DIAGNOSIS — M899 Disorder of bone, unspecified: Secondary | ICD-10-CM

## 2020-02-09 DIAGNOSIS — M8580 Other specified disorders of bone density and structure, unspecified site: Secondary | ICD-10-CM | POA: Diagnosis present

## 2020-02-09 DIAGNOSIS — Z8249 Family history of ischemic heart disease and other diseases of the circulatory system: Secondary | ICD-10-CM

## 2020-02-09 DIAGNOSIS — R079 Chest pain, unspecified: Secondary | ICD-10-CM

## 2020-02-09 DIAGNOSIS — Z800 Family history of malignant neoplasm of digestive organs: Secondary | ICD-10-CM | POA: Diagnosis not present

## 2020-02-09 DIAGNOSIS — I200 Unstable angina: Secondary | ICD-10-CM | POA: Diagnosis present

## 2020-02-09 DIAGNOSIS — I2542 Coronary artery dissection: Secondary | ICD-10-CM | POA: Diagnosis present

## 2020-02-09 DIAGNOSIS — Z888 Allergy status to other drugs, medicaments and biological substances status: Secondary | ICD-10-CM

## 2020-02-09 DIAGNOSIS — Z809 Family history of malignant neoplasm, unspecified: Secondary | ICD-10-CM | POA: Diagnosis not present

## 2020-02-09 DIAGNOSIS — Z83438 Family history of other disorder of lipoprotein metabolism and other lipidemia: Secondary | ICD-10-CM | POA: Diagnosis not present

## 2020-02-09 DIAGNOSIS — I2510 Atherosclerotic heart disease of native coronary artery without angina pectoris: Secondary | ICD-10-CM

## 2020-02-09 DIAGNOSIS — Z91040 Latex allergy status: Secondary | ICD-10-CM

## 2020-02-09 DIAGNOSIS — E559 Vitamin D deficiency, unspecified: Secondary | ICD-10-CM | POA: Diagnosis present

## 2020-02-09 DIAGNOSIS — Z833 Family history of diabetes mellitus: Secondary | ICD-10-CM | POA: Diagnosis not present

## 2020-02-09 DIAGNOSIS — I214 Non-ST elevation (NSTEMI) myocardial infarction: Secondary | ICD-10-CM

## 2020-02-09 DIAGNOSIS — E039 Hypothyroidism, unspecified: Secondary | ICD-10-CM | POA: Diagnosis present

## 2020-02-09 DIAGNOSIS — Z820 Family history of epilepsy and other diseases of the nervous system: Secondary | ICD-10-CM | POA: Diagnosis not present

## 2020-02-09 DIAGNOSIS — M810 Age-related osteoporosis without current pathological fracture: Secondary | ICD-10-CM | POA: Diagnosis present

## 2020-02-09 DIAGNOSIS — I25110 Atherosclerotic heart disease of native coronary artery with unstable angina pectoris: Secondary | ICD-10-CM | POA: Diagnosis present

## 2020-02-09 HISTORY — PX: LEFT HEART CATH AND CORONARY ANGIOGRAPHY: CATH118249

## 2020-02-09 LAB — CBC
HCT: 37.1 % (ref 36.0–46.0)
HCT: 31.1 % — ABNORMAL LOW (ref 36.0–46.0)
Hemoglobin: 11.8 g/dL — ABNORMAL LOW (ref 12.0–15.0)
Hemoglobin: 9.9 g/dL — ABNORMAL LOW (ref 12.0–15.0)
MCH: 28.7 pg (ref 26.0–34.0)
MCH: 28.7 pg (ref 26.0–34.0)
MCHC: 31.8 g/dL (ref 30.0–36.0)
MCHC: 31.8 g/dL (ref 30.0–36.0)
MCV: 90.3 fL (ref 80.0–100.0)
MCV: 90.1 fL (ref 80.0–100.0)
Platelets: 249 10*3/uL (ref 150–400)
Platelets: 210 10*3/uL (ref 150–400)
RBC: 4.11 MIL/uL (ref 3.87–5.11)
RBC: 3.45 MIL/uL — ABNORMAL LOW (ref 3.87–5.11)
RDW: 12.5 % (ref 11.5–15.5)
RDW: 12.5 % (ref 11.5–15.5)
WBC: 4.1 10*3/uL (ref 4.0–10.5)
WBC: 4.5 10*3/uL (ref 4.0–10.5)
nRBC: 0 % (ref 0.0–0.2)
nRBC: 0 % (ref 0.0–0.2)

## 2020-02-09 LAB — BASIC METABOLIC PANEL
Anion gap: 8 (ref 5–15)
BUN: <5 mg/dL — ABNORMAL LOW (ref 6–20)
CO2: 26 mmol/L (ref 22–32)
Calcium: 9.1 mg/dL (ref 8.9–10.3)
Chloride: 102 mmol/L (ref 98–111)
Creatinine, Ser: 0.6 mg/dL (ref 0.44–1.00)
GFR calc Af Amer: >60 mL/min (ref >60)
GFR calc non Af Amer: >60 mL/min (ref >60)
Glucose, Bld: 109 mg/dL — ABNORMAL HIGH (ref 70–99)
Potassium: 3.9 mmol/L (ref 3.5–5.1)
Sodium: 136 mmol/L (ref 135–145)

## 2020-02-09 LAB — TROPONIN I (HIGH SENSITIVITY)
Troponin I (High Sensitivity): 2457 ng/L (ref <18)
Troponin I (High Sensitivity): 3762 ng/L (ref <18)
Troponin I (High Sensitivity): 5075 ng/L (ref <18)
Troponin I (High Sensitivity): 5591 ng/L (ref <18)

## 2020-02-09 LAB — HEMOGLOBIN A1C
Hgb A1c MFr Bld: 5.6 % (ref 4.8–5.6)
Mean Plasma Glucose: 114.02 mg/dL

## 2020-02-09 LAB — I-STAT BETA HCG BLOOD, ED (MC, WL, AP ONLY): I-stat hCG, quantitative: <5 m[IU]/mL (ref <5)

## 2020-02-09 LAB — ECHOCARDIOGRAM COMPLETE
Height: 70 in
Weight: 2688 oz

## 2020-02-09 LAB — TSH: TSH: 0.466 u[IU]/mL (ref 0.350–4.500)

## 2020-02-09 LAB — CREATININE, SERUM
Creatinine, Ser: 0.41 mg/dL — ABNORMAL LOW (ref 0.44–1.00)
GFR calc Af Amer: >60 mL/min (ref >60)
GFR calc non Af Amer: >60 mL/min (ref >60)

## 2020-02-09 LAB — SARS CORONAVIRUS 2 BY RT PCR (HOSPITAL ORDER, PERFORMED IN ~~LOC~~ HOSPITAL LAB): SARS Coronavirus 2: NEGATIVE

## 2020-02-09 LAB — T4, FREE: Free T4: 1.45 ng/dL — ABNORMAL HIGH (ref 0.61–1.12)

## 2020-02-09 SURGERY — LEFT HEART CATH AND CORONARY ANGIOGRAPHY
Anesthesia: LOCAL

## 2020-02-09 MED ORDER — ATORVASTATIN CALCIUM 80 MG PO TABS
80.0000 mg | ORAL_TABLET | Freq: Every day | ORAL | Status: DC
  Administered 2020-02-09 – 2020-02-11 (×3): 80 mg via ORAL
  Filled 2020-02-09 (×3): qty 1

## 2020-02-09 MED ORDER — CLOPIDOGREL BISULFATE 75 MG PO TABS
75.0000 mg | ORAL_TABLET | Freq: Every day | ORAL | Status: DC
  Administered 2020-02-10 – 2020-02-12 (×3): 75 mg via ORAL
  Filled 2020-02-09 (×3): qty 1

## 2020-02-09 MED ORDER — SODIUM CHLORIDE 0.9 % IV SOLN
INTRAVENOUS | Status: AC
  Administered 2020-02-09: 125 mL/h via INTRAVENOUS

## 2020-02-09 MED ORDER — HEPARIN SODIUM (PORCINE) 1000 UNIT/ML IJ SOLN
INTRAMUSCULAR | Status: DC | PRN
  Administered 2020-02-09: 3500 [IU] via INTRAVENOUS

## 2020-02-09 MED ORDER — MIDAZOLAM HCL 2 MG/2ML IJ SOLN
INTRAMUSCULAR | Status: DC | PRN
  Administered 2020-02-09: 1 mg via INTRAVENOUS

## 2020-02-09 MED ORDER — VERAPAMIL HCL 2.5 MG/ML IV SOLN
INTRAVENOUS | Status: AC
  Filled 2020-02-09: qty 2

## 2020-02-09 MED ORDER — ACETAMINOPHEN 325 MG PO TABS: 650.0000 mg | ORAL_TABLET | ORAL | Status: DC | PRN

## 2020-02-09 MED ORDER — SODIUM CHLORIDE 0.9 % WEIGHT BASED INFUSION: 1.0000 mL/kg/h | INTRAVENOUS | Status: DC

## 2020-02-09 MED ORDER — SODIUM CHLORIDE 0.9 % IV SOLN: 250.0000 mL | INTRAVENOUS | Status: DC | PRN

## 2020-02-09 MED ORDER — NITROGLYCERIN 0.4 MG SL SUBL: 0.4000 mg | SUBLINGUAL_TABLET | SUBLINGUAL | Status: DC | PRN

## 2020-02-09 MED ORDER — NITROGLYCERIN 1 MG/10 ML FOR IR/CATH LAB
INTRA_ARTERIAL | Status: AC
  Filled 2020-02-09: qty 10

## 2020-02-09 MED ORDER — SODIUM CHLORIDE 0.9 % IV SOLN
INTRAVENOUS | Status: AC | PRN
  Administered 2020-02-09: 50 mL/h via INTRAVENOUS

## 2020-02-09 MED ORDER — ASPIRIN EC 81 MG PO TBEC: 81.0000 mg | DELAYED_RELEASE_TABLET | Freq: Every day | ORAL | Status: DC

## 2020-02-09 MED ORDER — SODIUM CHLORIDE 0.9% FLUSH: 3.0000 mL | INTRAVENOUS | Status: DC | PRN

## 2020-02-09 MED ORDER — LEVOTHYROXINE SODIUM 100 MCG PO TABS
100.0000 ug | ORAL_TABLET | Freq: Every day | ORAL | Status: DC
  Administered 2020-02-10 – 2020-02-12 (×3): 100 ug via ORAL
  Filled 2020-02-09 (×3): qty 1

## 2020-02-09 MED ORDER — HEPARIN (PORCINE) IN NACL 1000-0.9 UT/500ML-% IV SOLN
INTRAVENOUS | Status: AC
  Filled 2020-02-09: qty 1000

## 2020-02-09 MED ORDER — ACETAMINOPHEN 325 MG PO TABS
ORAL_TABLET | ORAL | Status: AC
  Filled 2020-02-09: qty 2

## 2020-02-09 MED ORDER — FENTANYL CITRATE (PF) 100 MCG/2ML IJ SOLN
INTRAMUSCULAR | Status: DC | PRN
  Administered 2020-02-09: 25 ug via INTRAVENOUS

## 2020-02-09 MED ORDER — LIDOCAINE HCL (PF) 1 % IJ SOLN
INTRAMUSCULAR | Status: AC
  Filled 2020-02-09: qty 30

## 2020-02-09 MED ORDER — HEPARIN SODIUM (PORCINE) 5000 UNIT/ML IJ SOLN
5000.0000 [IU] | Freq: Three times a day (TID) | INTRAMUSCULAR | Status: DC
  Administered 2020-02-09 – 2020-02-10 (×2): 5000 [IU] via SUBCUTANEOUS
  Filled 2020-02-09 (×2): qty 1

## 2020-02-09 MED ORDER — ASPIRIN 81 MG PO CHEW
81.0000 mg | CHEWABLE_TABLET | Freq: Every day | ORAL | Status: DC
  Administered 2020-02-09 – 2020-02-12 (×4): 81 mg via ORAL
  Filled 2020-02-09 (×4): qty 1

## 2020-02-09 MED ORDER — MELATONIN 3 MG PO TABS
3.0000 mg | ORAL_TABLET | Freq: Every day | ORAL | Status: DC
  Administered 2020-02-09 – 2020-02-11 (×3): 3 mg via ORAL
  Filled 2020-02-09 (×3): qty 1

## 2020-02-09 MED ORDER — FENTANYL CITRATE (PF) 100 MCG/2ML IJ SOLN
INTRAMUSCULAR | Status: AC
  Filled 2020-02-09: qty 2

## 2020-02-09 MED ORDER — LIDOCAINE HCL (PF) 1 % IJ SOLN
INTRAMUSCULAR | Status: DC | PRN
  Administered 2020-02-09: 4 mL

## 2020-02-09 MED ORDER — ONDANSETRON HCL 4 MG/2ML IJ SOLN: 4.0000 mg | Freq: Four times a day (QID) | INTRAMUSCULAR | Status: DC | PRN

## 2020-02-09 MED ORDER — SODIUM CHLORIDE 0.9 % WEIGHT BASED INFUSION: 3.0000 mL/kg/h | INTRAVENOUS | Status: DC

## 2020-02-09 MED ORDER — ASPIRIN 81 MG PO CHEW: 81.0000 mg | CHEWABLE_TABLET | ORAL | Status: DC

## 2020-02-09 MED ORDER — HEPARIN SODIUM (PORCINE) 1000 UNIT/ML IJ SOLN
INTRAMUSCULAR | Status: AC
  Filled 2020-02-09: qty 1

## 2020-02-09 MED ORDER — IOHEXOL 350 MG/ML SOLN
INTRAVENOUS | Status: DC | PRN
  Administered 2020-02-09: 50 mL

## 2020-02-09 MED ORDER — MIDAZOLAM HCL 2 MG/2ML IJ SOLN
INTRAMUSCULAR | Status: AC
  Filled 2020-02-09: qty 2

## 2020-02-09 MED ORDER — VERAPAMIL HCL 2.5 MG/ML IV SOLN
INTRAVENOUS | Status: DC | PRN
  Administered 2020-02-09: 10 mL via INTRA_ARTERIAL

## 2020-02-09 MED ORDER — HYDRALAZINE HCL 20 MG/ML IJ SOLN: 10.0000 mg | INTRAMUSCULAR | Status: AC | PRN

## 2020-02-09 MED ORDER — SODIUM CHLORIDE 0.9% FLUSH
3.0000 mL | Freq: Two times a day (BID) | INTRAVENOUS | Status: DC
  Administered 2020-02-09 – 2020-02-12 (×5): 3 mL via INTRAVENOUS

## 2020-02-09 MED ORDER — SODIUM CHLORIDE 0.9% FLUSH: 3.0000 mL | Freq: Two times a day (BID) | INTRAVENOUS | Status: DC

## 2020-02-09 MED ORDER — ACETAMINOPHEN 325 MG PO TABS
650.0000 mg | ORAL_TABLET | ORAL | Status: DC | PRN
  Administered 2020-02-09: 650 mg via ORAL
  Filled 2020-02-09: qty 2

## 2020-02-09 MED ORDER — LABETALOL HCL 5 MG/ML IV SOLN: 10.0000 mg | INTRAVENOUS | Status: AC | PRN

## 2020-02-09 MED ORDER — METOPROLOL TARTRATE 12.5 MG HALF TABLET
12.5000 mg | ORAL_TABLET | Freq: Two times a day (BID) | ORAL | Status: DC
  Administered 2020-02-09 – 2020-02-12 (×6): 12.5 mg via ORAL
  Filled 2020-02-09 (×6): qty 1

## 2020-02-09 MED ORDER — HEPARIN (PORCINE) IN NACL 1000-0.9 UT/500ML-% IV SOLN
INTRAVENOUS | Status: DC | PRN
  Administered 2020-02-09 (×2): 500 mL

## 2020-02-09 SURGICAL SUPPLY — 10 items
CATH 5FR JL3.5 JR4 ANG PIG MP (CATHETERS) ×2 IMPLANT
DEVICE RAD TR BAND REGULAR (VASCULAR PRODUCTS) ×2 IMPLANT
GLIDESHEATH SLEND A-KIT 6F 22G (SHEATH) ×2 IMPLANT
GUIDEWIRE INQWIRE 1.5J.035X260 (WIRE) ×1 IMPLANT
INQWIRE 1.5J .035X260CM (WIRE) ×2
KIT HEART LEFT (KITS) ×2 IMPLANT
PACK CARDIAC CATHETERIZATION (CUSTOM PROCEDURE TRAY) ×2 IMPLANT
SHEATH PROBE COVER 6X72 (BAG) ×2 IMPLANT
TRANSDUCER W/STOPCOCK (MISCELLANEOUS) ×2 IMPLANT
TUBING CIL FLEX 10 FLL-RA (TUBING) ×2 IMPLANT

## 2020-02-09 NOTE — ED Provider Notes (Signed)
[place] EMERGENCY DEPARTMENT Provider Note   CSN: [id number] Arrival date & time: 02/09/20  1356     History Chief Complaint  Patient presents with  . Chest Pain    Stefanie [name] is a 44 y.o. female who presents with a cc of Chest pain. Sxs. Started Friday, 02/06/2020.  She had onset of severe chest pain and pressure which she describes as "like an elephant sitting on my chest.  Her symptoms started suddenly and got progressively worse.  She felt short of breath and had one episode of vomiting.  She took to light Tylenol and eventually her symptoms improved and she was able to get to sleep.  The next day she had return of symptoms which again resolved.  She did not have symptoms Sunday.  Today her symptoms returned and she called 911.  Her initial EKG is reported to have shown diffuse ST segment depression on her twelve-lead by EMS.  The patient was given 325 of aspirin and now has no chest pain but still feels some moderate pressure.  She is a history of Graves' thyroiditis and takes Synthroid.  She has no other medical conditions.  She denies a history of high cholesterol, hypertension, smoking.  No one her in her family has a history of MI or CAD.  She denies history of DVT/PE, unilateral leg swelling, recent confinement.  She has no active shortness of breath.  The patient had a miscarriage in April.  She states that today the pain was radiating between her shoulder blades.  She denies a history of connective tissue disorder.  HPI  HPI: A 44 year old patient presents for evaluation of chest pain. Initial onset of pain was less than one hour ago. The patient's chest pain is described as heaviness/pressure/tightness and is not worse with exertion. The patient complains of nausea. The patient's chest pain is middle- or left-sided, is not well-localized, is not sharp and does not radiate to the arms/jaw/neck. The patient denies diaphoresis. The patient has no history of  stroke, has no history of peripheral artery disease, has not smoked in the past 90 days, denies any history of treated diabetes, has no relevant family history of coronary artery disease (first degree relative at less than age 34), is not hypertensive, has no history of hypercholesterolemia and does not have an elevated BMI (>=30).   Past Medical History:  Diagnosis Date  . Benign neoplasm of thyroid gland   . Breast cyst 2014  . Osteopenia   . Thyroid disease    hypothyroidism  . Vitamin D deficiency 03/25/2018    Patient Active Problem List   Diagnosis Date Noted  . History of cesarean delivery 09/23/2018  . Breast lump in lower-outer quadrant 06/03/2018  . Family history of colon cancer in mother 06/03/2018  . 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 Vitamin D deficiency 03/25/2018  . Hypothyroidism 09/24/2017  . Umbilical hernia 10/93/2355  . History of osteoporosis 05/16/2017  . Thyroid nodule 05/16/2017  . Uterine leiomyoma 03/07/2016    Past Surgical History:  Procedure Laterality Date  . BREAST SURGERY     left aspiration   . CESAREAN SECTION  07/20/2016     OB History    Gravida  3   Para  1   Term  1   Preterm  0   AB  2   Living  1     SAB  2   TAB      Ectopic  Multiple      Live Births           Obstetric Comments  1st Menstrual Cycle:  89 1st Pregnancy:  57 - had a miscarriage 2nd pregnancy: 39 viable pregnancy 3rd pregnancy: age 37 miscarriage        Family History  Problem Relation Age of Onset  . Cancer - Colon Mother 15  . Hyperlipidemia Mother   . Hypertension Mother   . Diabetes Mellitus II Father   . Cancer Maternal Grandmother 39  . Alzheimer's disease Paternal Grandmother     Social History   Tobacco Use  . Smoking status: Never Smoker  . Smokeless tobacco: Never Used  Vaping Use  . Vaping Use: Never used  Substance Use Topics  . Alcohol use: No  . Drug use: No    Home Medications Prior to  Admission medications   Medication Sig Start Date End Date Taking? Authorizing Provider  levothyroxine (SYNTHROID, LEVOTHROID) 100 MCG tablet Take 100 mcg by mouth daily before breakfast.     [provider]  Vitamin D, Ergocalciferol, (DRISDOL) 50000 units CAPS capsule Take 1 capsule by mouth once a week. 09/15/17   [provider]    Allergies    Fosamax [alendronate], Reclast [zoledronic acid], and Latex  Review of Systems   Review of Systems Ten systems reviewed and are negative for acute change, except as noted in the HPI.   Physical Exam Updated Vital Signs BP 129/71   Pulse 76   Temp 98.2 F (36.8 C) (Oral)   Resp 18   Ht 5\' 10"  (1.778 m)   Wt 76.2 kg   LMP 02/01/2020   SpO2 98%   BMI 24.11 kg/m   Physical Exam Vitals and nursing note reviewed.  Constitutional:      General: She is not in acute distress.    Appearance: She is well-developed. She is not diaphoretic.  HENT:     Head: Normocephalic and atraumatic.  Eyes:     General: No scleral icterus.    Conjunctiva/sclera: Conjunctivae normal.  Cardiovascular:     Rate and Rhythm: Normal rate and regular rhythm.     Heart sounds: Normal heart sounds. No murmur heard.  No friction rub. No gallop.   Pulmonary:     Effort: Pulmonary effort is normal. No respiratory distress.     Breath sounds: Normal breath sounds.  Abdominal:     General: Bowel sounds are normal. There is no distension.     Palpations: Abdomen is soft. There is no mass.     Tenderness: There is no abdominal tenderness. There is no guarding.  Musculoskeletal:     Cervical back: Normal range of motion.     Right lower leg: No tenderness.     Left lower leg: No tenderness.  Skin:    General: Skin is warm and dry.  Neurological:     Mental Status: She is alert and oriented to person, place, and time.  Psychiatric:        Behavior: Behavior normal.     ED Results / Procedures / Treatments   Labs (all labs ordered are  listed, but only abnormal results are displayed) Labs Reviewed  BASIC METABOLIC PANEL - Abnormal; Notable for the following components:      Result Value   Glucose, Bld 109 (*)    BUN <5 (*)    All other components within normal limits  CBC - Abnormal; Notable for the following components:   Hemoglobin  11.8 (*)    All other components within normal limits  TROPONIN I (HIGH SENSITIVITY) - Abnormal; Notable for the following components:   Troponin I (High Sensitivity) 2,457 (*)    All other components within normal limits  SARS CORONAVIRUS 2 BY RT PCR (HOSPITAL ORDER, [place] [facility])  I-STAT BETA HCG BLOOD, ED ([facility], [facility], [facility] ONLY)  TROPONIN I (HIGH SENSITIVITY)    EKG EKG Interpretation  Date/Time:  Monday February 09 2020 13:58:41 EDT Ventricular Rate:  71 PR Interval:  180 QRS Duration: 94 QT Interval:  388 QTC Calculation: 421 R Axis:   56 Text Interpretation: Normal sinus rhythm Nonspecific ST abnormality Abnormal ECG Confirmed by Milton Ferguson [phone]) on 02/09/2020 3:19:52 PM   Radiology DG Chest 2 View  Result Date: 02/09/2020 CLINICAL DATA:  44 year old female with history of chest pain. EXAM: CHEST - 2 VIEW COMPARISON:  No priors. FINDINGS: Lung volumes are normal. No consolidative airspace disease. No pleural effusions. No pneumothorax. No pulmonary nodule or mass noted. Pulmonary vasculature and the cardiomediastinal silhouette are within normal limits. Sclerotic lesion in the left proximal humerus measuring approximately 3.3 x 2.7 cm. IMPRESSION: 1. No radiographic evidence of acute cardiopulmonary disease. Mixed lucent and sclerotic lesion in the proximal left humerus, which may be chondroid. 2. 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 Electronically Signed   By: Vinnie Langton M.D.   On: 02/09/2020 14:48     Procedures .Critical Care Performed by: Margarita Mail, PA-C Authorized by: Margarita Mail, PA-C   Critical care provider statement:    Critical care time (minutes):  45   Critical care time was exclusive of:  Separately billable procedures and treating other patients   Critical care was necessary to treat or prevent imminent or life-threatening deterioration of the following conditions:  Cardiac failure   Critical care was time spent personally by me on the following activities:  Discussions with consultants, evaluation of patient's response to treatment, examination of patient, ordering and performing treatments and interventions, ordering and review of laboratory studies, ordering and review of radiographic studies, pulse oximetry, re-evaluation of patient's condition, obtaining history from patient or surrogate and review of old charts   (including critical care time)  Medications Ordered in ED Medications - No data to display  ED Course  I have reviewed the triage vital signs and the nursing notes.  Pertinent labs & imaging results that were available during my care of the patient were reviewed by me and considered in my medical decision making (see chart for details).    MDM Rules/Calculators/A&P HEAR Score: 2                        PR:FFMBW pain VS: BP 129/71   Pulse 76   Temp 98.2 F (36.8 C) (Oral)   Resp 18   Ht 5\' 10"  (1.778 m)   Wt 76.2 kg   LMP 02/01/2020   SpO2 98%   BMI 24.11 kg/m   GY:KZLDJTT is gathered by patient  and EMR. Previous records obtained and reviewed. DDX:The patient's complaint of chest pain involves an extensive number of diagnostic and treatment options, and is a complaint that carries with it a high risk of complications, morbidity, and potential mortality. Given the large differential diagnosis, medical decision making is of high complexity. The emergent differential diagnosis of chest pain includes: Acute  coronary syndrome,  pericarditis, aortic dissection, Coronary artery dissection. pulmonary embolism, tension pneumothorax, pneumonia, and esophageal rupture.   Labs: I ordered reviewed and interpreted labs which include  CBC- mild normocytic anemia BMP- elevated glucose of insignificant value Negative pregnancy Covid pending Tropnin is sig elevated. Imaging: I ordered and reviewed images which included cxr . I independently visualized and interpreted all imaging.There are no acute, significant findings on today's images. Incidental radiopaque  lesion in the left Humerus on plain film will need op follow up. EKG: Normal sinus rhythm at a rate of 71 with nonspecific ST segment changes Consults: Cardiology for admission MDM: Patient with NSTEMI, cardiology plans to admit and take her to the Cath Lab.  She is a very low heart score given her lack of risk factors question.  Given her recent miscarriage I question if that she could have a potential coronary artery dissection.  Patient also has an incidental finding of a radiopaque lesion in the left humerus which she will need outpatient follow-up for. Patient disposition:admit  The patient appears reasonably stabilized for admission considering the current resources, flow, and capabilities available in the ED at this time, and I doubt any other [facility] requiring further screening and/or treatment in the ED prior to admission.        Final Clinical Impression(s) / ED Diagnoses Final diagnoses:  None    Rx / DC Orders ED Discharge Orders    None       Margarita Mail, PA-C 02/09/20 1706    Milton Ferguson, MD 02/10/20 [phone]

## 2020-02-09 NOTE — Progress Notes (Deleted)
Patient is a 44 year old female patient of Dr. Ancil Boozer Last visit at [facility] was in November 2020 for her well woman exam. She called yesterday with complaints of chest pains, the triage nurse instructed her to go to the emergency room, although she declined and wanted to follow-up with an office visit. She follows up today.  Pt. Reports she has had chest pain "on and off for 2 months.Tylenol seems to help." had pain Friday and Saturday night. No pain today."Seems to happen only in the  evening." No radiation of pain. Vomited Friday after supper. No other symptoms. No availability with her PCP. Instructed to go to ED. Declines, wants office visit   She was seen in the emergency department in February 2021 for flank pain and abdominal pain, and she also noted that a couple weeks prior she had a sudden onset of sharp chest pain that was better at that time.  Work-up included CT chest PE protocol including a CT angiogram of the chest that was negative for pulmonary embolus and noted a minimal bandlike opacity right middle lobe that was likely atelectasis.  The CT renal stone protocol and CT abdomen and pelvis without contrast were unremarkable. EKG: HR 81 bpm. normal EKG, normal sinus rhythm, there are no previous tracings available for comparison. POC COVID-19-negative.  Given ED work-up was very reassuring, suspected etiology of patient's symptoms is musculoskeletal pain.   Last labs from October and November 2020 were reviewed.  No findings of concern.  This also included a TSH.which was normal (she does have a h/o thyroid disease, on a supplement presently.

## 2020-02-09 NOTE — Interval H&P Note (Signed)
Cath Lab Visit (complete for each Cath Lab visit)  Clinical Evaluation Leading to the Procedure:   ACS: Yes.    Non-ACS:    Anginal Classification: CCS IV  Anti-ischemic medical therapy: No Therapy  Non-Invasive Test Results: No non-invasive testing performed  Prior CABG: No previous CABG      History and Physical Interval Note:  02/09/2020 4:50 PM  Stefanie [name]  has presented today for surgery, with the diagnosis of unstable angina.  The various methods of treatment have been discussed with the patient and family. After consideration of risks, benefits and other options for treatment, the patient has consented to  Procedure(s): LEFT HEART CATH AND CORONARY ANGIOGRAPHY (N/A) as a surgical intervention.  The patient's history has been reviewed, patient examined, no change in status, stable for surgery.  I have reviewed the patient's chart and labs.  Questions were answered to the patient's satisfaction.     [name]

## 2020-02-09 NOTE — Progress Notes (Signed)
  Echocardiogram 2D Echocardiogram has been performed.  Stefanie [name] M 02/09/2020, 4:31 PM

## 2020-02-09 NOTE — H&P (Signed)
Cardiology Admission History and Physical:   Patient ID: Stefanie [name] MRN: [id number]; DOB: Dec 28, 1975   Admission date: 02/09/2020  Primary Care Provider: Steele Sizer, MD [facility] Cardiologist: Jenkins Rouge, MD  [facility] Electrophysiologist:  None   Chief Complaint:  NSTEMI  Patient Profile:   Stefanie [name] is a 44 y.o. female with history of thyroid disease.   History of Present Illness:   Ms. Stefanie [name] has no prior cardiac history. She developed chest pain Friday evening. She describes chest pain that felt like a pressure/elephant sitting on her chest. CP was relieved with tylenol. She had a recurrence of chest pain on Saturday, none on Sunday. CP returned this morning. She called PCP who instructed ER visit.  Pain progressed and she called EMS. 324 mg ASA given and pain relieved from 10/10 to 2/10. Syndrome started after eating a salad on Friday and has been studdering since. Pain radiated to back and jaw. No LE edema, dyspnea or pleuritic/positional component ECG with EMS showed some diffuse ST depression ECG in ER normal with no ST elevation or signs of pericarditis.   Bedside echo done No effusion  Normal EF 65-70% No significant valve disease  Normal aortic root.  She had her 2nd covid vaccine in April No other constitutional / URI symptoms  No significant family history  No drugs/cocaine / stimulants    Past Medical History:  Diagnosis Date  . Benign neoplasm of thyroid gland   . Breast cyst 2014  . Osteopenia   . Thyroid disease    hypothyroidism  . Vitamin D deficiency 03/25/2018    Past Surgical History:  Procedure Laterality Date  . BREAST SURGERY     left aspiration   . CESAREAN SECTION  07/20/2016     Medications Prior to Admission: Prior to Admission medications   Medication Sig Start Date End Date Taking? Authorizing Provider  levothyroxine (SYNTHROID, LEVOTHROID) 100 MCG tablet Take 100 mcg by mouth daily before breakfast.     Yes [provider]  Multiple Vitamin (MULTIVITAMIN ADULT PO) Take 1 tablet by mouth daily.   Yes [provider]  Vitamin D, Ergocalciferol, (DRISDOL) 50000 units CAPS capsule Take 50,000 Units by mouth once a week. Wednesdays 09/15/17  Yes [provider]     Allergies:    Allergies  Allergen Reactions  . Fosamax [Alendronate] Other (See Comments)    stomachache  . Latex Itching  . Reclast [Zoledronic Acid] Rash    Social History:   Social History   Socioeconomic History  . Marital status: Married    Spouse name: Rashika Bettes  . Number of children: 1  . Years of education: 26  . Highest education level: Associate degree: academic program  Occupational History    Comment: prn for health unit coordinator   Tobacco Use  . Smoking status: Never Smoker  . Smokeless tobacco: Never Used  Vaping Use  . Vaping Use: Never used  Substance and Sexual Activity  . Alcohol use: No  . Drug use: No  . Sexual activity: Yes    Partners: Male    Birth control/protection: None  Other Topics Concern  . Not on file  Social History Narrative   Married since 2015, they have one child born 07/20/2016   Works part time at [employer]:   . Difficulty of Paying Living Expenses:   Food Insecurity:   . Worried About Charity fundraiser in the Last  Year:   . Ran Out of Food in the Last Year:   Transportation Needs:   . Film/video editor (Medical):   Marland Kitchen Lack of Transportation (Non-Medical):   Physical Activity: Sufficiently Active  . Days of Exercise per Week: 3 days  . Minutes of Exercise per Session: 60 min  Stress:   . Feeling of Stress :   Social Connections:   . Frequency of Communication with Friends and Family:   . Frequency of Social Gatherings with Friends and Family:   . Attends Religious Services:   . Active Member of Clubs or Organizations:   . Attends Archivist Meetings:   Marland Kitchen  Marital Status:   Intimate Partner Violence:   . Fear of Current or Ex-Partner:   . Emotionally Abused:   Marland Kitchen Physically Abused:   . Sexually Abused:     Family History:  Negative for premature CAD  The patient's family history includes Alzheimer's disease in her paternal grandmother; Cancer (age of onset: 4) in her maternal grandmother; Cancer - Colon (age of onset: 5) in her mother; Diabetes Mellitus II in her father; Hyperlipidemia in her mother; Hypertension in her mother.    ROS:  Please see the history of present illness.  Negative All other ROS reviewed and negative.     Physical Exam/Data:   Vitals:   02/09/20 1357 02/09/20 1358  BP:  129/71  Pulse:  76  Resp:  18  Temp:  98.2 F (36.8 C)  TempSrc:  Oral  SpO2:  98%  Weight: 76.2 kg   Height: 5\' 10"  (1.778 m)    No intake or output data in the 24 hours ending 02/09/20 1635 Last 3 Weights 02/09/2020 10/30/2019 06/09/2019  Weight (lbs) 168 lb 166 lb 167 lb 1.6 oz  Weight (kg) 76.204 kg 75.297 kg 75.796 kg     Body mass index is 24.11 kg/m.  Affect appropriate Healthy:  appears stated age 12: normal Neck supple with no adenopathy JVP normal no bruits no thyromegaly Lungs clear with no wheezing and good diaphragmatic motion Heart:  S1/S2 no murmur, no rub, gallop or click PMI normal Abdomen: benighn, BS positve, no tenderness, no AAA no bruit.  No HSM or HJR Distal pulses intact with no bruits No edema Neuro non-focal Skin warm and dry No muscular weakness    EKG:  The ECG that was done was personally reviewed and demonstrates sinus rhythm with HR 71, ST depression inferior leads and lateral leads  Relevant CV Studies:  See HPI  Laboratory Data:  High Sensitivity Troponin:   Recent Labs  Lab 02/09/20 1411  TROPONINIHS 2,457*      Chemistry Recent Labs  Lab 02/09/20 1411  NA 136  K 3.9  CL 102  CO2 26  GLUCOSE 109*  BUN <5*  CREATININE 0.60  CALCIUM 9.1  GFRNONAA >60  GFRAA >60   ANIONGAP 8    No results for input(s): PROT, ALBUMIN, AST, ALT, ALKPHOS, BILITOT in the last 168 hours. Hematology Recent Labs  Lab 02/09/20 1411  WBC 4.1  RBC 4.11  HGB 11.8*  HCT 37.1  MCV 90.3  MCH 28.7  MCHC 31.8  RDW 12.5  PLT 249   BNPNo results for input(s): BNP, PROBNP in the last 168 hours.  DDimer No results for input(s): DDIMER in the last 168 hours.   Radiology/Studies:  DG Chest 2 View  Result Date: 02/09/2020 CLINICAL DATA:  44 year old female with history of chest pain. EXAM: CHEST - 2  VIEW COMPARISON:  No priors. FINDINGS: Lung volumes are normal. No consolidative airspace disease. No pleural effusions. No pneumothorax. No pulmonary nodule or mass noted. Pulmonary vasculature and the cardiomediastinal silhouette are within normal limits. Sclerotic lesion in the left proximal humerus measuring approximately 3.3 x 2.7 cm. IMPRESSION: 1. No radiographic evidence of acute cardiopulmonary disease. Mixed lucent and sclerotic lesion in the proximal left humerus, which may be chondroid. 2. 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 Electronically Signed   By: Vinnie Langton M.D.   On: 02/09/2020 14:48   {  Assessment and Plan:   Chest pain- with markedly elevated troponin and transient ECG changes. Concern for coronary artery dissection. Hemodynamics stable. Echo benign ECG normalized. CXR no CE normal mediastinum and aortic root normal on echo Discussed options and favor acute heart catheterization. Risks including bleeding and stroke discussed. She has had contrast before Pregnancy test negative. If coronary dissection / CAD not found will need cardiac MRI in am No heparin for now Discussed case with Dr Tamala Julian who will proceed as soon as cath lab available     Severity of Illness: The appropriate patient status for this patient is INPATIENT.  Inpatient status is judged to be reasonable and necessary in order to provide the required intensity of service to ensure the patient's safety. The patient's presenting symptoms, physical exam findings, and initial radiographic and laboratory data in the context of their chronic comorbidities is felt to place them at high risk for further clinical deterioration. Furthermore, it is not anticipated that the patient will be medically stable for discharge from the hospital within 2 midnights of admission. The following factors support the patient status of inpatient.   " The patient's presenting symptoms include chest pain . " The worrisome physical exam findings include none . " The initial radiographic and laboratory data are worrisome because of elevated troponin abnormal ECG. " The chronic co-morbidities include none .   * I certify that at the point of admission it is my clinical judgment that the patient will require inpatient hospital care spanning beyond 2 midnights from the point of admission due to high intensity of service, high risk for further deterioration and high frequency of surveillance required.*   Jenkins Rouge MD [facility]

## 2020-02-09 NOTE — CV Procedure (Signed)
   Left heart cath with coronary angiography via right radial approach. Real-time vascular ultrasound used for arterial access.  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 Right dominant coronary anatomy.  Normal LVEDP. LVEF greater than 55%.  Recommendation: Dual antiplatelet therapy, blood pressure control to include beta-blockade and if possible ACE/ARB therapy. Sublingual nitroglycerin if recurrent chest pain. Consider carotid and renal imaging to exclude fibromuscular dysplasia. Will check hs-CRP and sed rate to gauge level of inflammation. If significant, consider adding statin therapy.

## 2020-02-09 NOTE — Telephone Encounter (Signed)
Pt. Reports she has had chest pain "on and off for 2 months.Tylenol seems to help." had pain Friday and Saturday night. No pain today."Seems to happen only in the evening." No radiation of pain. Vomited Friday after supper. No other symptoms. No availability with her PCP. Instructed to go to ED. Declines, wants office visit. Appointment made. Instructed to go to ED if pain returns. Reason for Disposition  [1] Chest pain lasts > 5 minutes AND [2] occurred > 3 days ago (72 hours) AND [3] NO chest pain or cardiac symptoms now  Answer Assessment - Initial Assessment Questions 1. LOCATION: "Where does it hurt?"       Hurt in the center 2. RADIATION: "Does the pain go anywhere else?" (e.g., into neck, jaw, arms, back)     No 3. ONSET: "When did the chest pain begin?" (Minutes, hours or days)      Several hours 4. PATTERN "Does the pain come and go, or has it been constant since it started?"  "Does it get worse with exertion?"      Comes and goes 5. DURATION: "How long does it last" (e.g., seconds, minutes, hours)     Hours 6. SEVERITY: "How bad is the pain?"  (e.g., Scale 1-10; mild, moderate, or severe)    - MILD (1-3): doesn't interfere with normal activities     - MODERATE (4-7): interferes with normal activities or awakens from sleep    - SEVERE (8-10): excruciating pain, unable to do any normal activities       8 7. CARDIAC RISK FACTORS: "Do you have any history of heart problems or risk factors for heart disease?" (e.g., angina, prior heart attack; diabetes, high blood pressure, high cholesterol, smoker, or strong family history of heart disease)     No 8. PULMONARY RISK FACTORS: "Do you have any history of lung disease?"  (e.g., blood clots in lung, asthma, emphysema, birth control pills)     Asthma 9. CAUSE: "What do you think is causing the chest pain?"     Unsure 10. OTHER SYMPTOMS: "Do you have any other symptoms?" (e.g., dizziness, nausea, vomiting, sweating, fever, difficulty  breathing, cough)       Vomited x 1 Friday 11. PREGNANCY: "Is there any chance you are pregnant?" "When was your last menstrual period?"       No  Protocols used: CHEST PAIN-A-AH

## 2020-02-09 NOTE — ED Triage Notes (Signed)
Pt from home with ems for intermittent chest pain since Friday. EKG did show depression in multiple leads. Pt given 324 ASA and supplemental oxygen for comfort, Pain went from 10/10 to 2/10. 20G RAC

## 2020-02-10 ENCOUNTER — Ambulatory Visit: Payer: 59 | Admitting: Internal Medicine

## 2020-02-10 ENCOUNTER — Encounter (HOSPITAL_COMMUNITY): Payer: Self-pay | Admitting: Interventional Cardiology

## 2020-02-10 LAB — CBC
HCT: 37.1 % (ref 36.0–46.0)
Hemoglobin: 12.1 g/dL (ref 12.0–15.0)
MCH: 29.6 pg (ref 26.0–34.0)
MCHC: 32.6 g/dL (ref 30.0–36.0)
MCV: 90.7 fL (ref 80.0–100.0)
Platelets: 260 10*3/uL (ref 150–400)
RBC: 4.09 MIL/uL (ref 3.87–5.11)
RDW: 12.6 % (ref 11.5–15.5)
WBC: 5.3 10*3/uL (ref 4.0–10.5)
nRBC: 0 % (ref 0.0–0.2)

## 2020-02-10 LAB — SEDIMENTATION RATE: Sed Rate: 8 mm/hr (ref 0–22)

## 2020-02-10 LAB — BASIC METABOLIC PANEL
Anion gap: 7 (ref 5–15)
BUN: 5 mg/dL — ABNORMAL LOW (ref 6–20)
CO2: 24 mmol/L (ref 22–32)
Calcium: 9.1 mg/dL (ref 8.9–10.3)
Chloride: 106 mmol/L (ref 98–111)
Creatinine, Ser: 0.6 mg/dL (ref 0.44–1.00)
GFR calc Af Amer: >60 mL/min (ref >60)
GFR calc non Af Amer: >60 mL/min (ref >60)
Glucose, Bld: 96 mg/dL (ref 70–99)
Potassium: 3.9 mmol/L (ref 3.5–5.1)
Sodium: 137 mmol/L (ref 135–145)

## 2020-02-10 LAB — LIPID PANEL
Cholesterol: 155 mg/dL (ref 0–200)
HDL: 51 mg/dL (ref >40)
LDL Cholesterol: 96 mg/dL (ref 0–99)
Total CHOL/HDL Ratio: 3 RATIO
Triglycerides: 42 mg/dL (ref <150)
VLDL: 8 mg/dL (ref 0–40)

## 2020-02-10 MED ORDER — CHLORHEXIDINE GLUCONATE CLOTH 2 % EX PADS
6.0000 | MEDICATED_PAD | Freq: Every day | CUTANEOUS | Status: DC
  Administered 2020-02-09: 6 via TOPICAL

## 2020-02-10 MED ORDER — BACITRACIN-NEOMYCIN-POLYMYXIN OINTMENT TUBE
TOPICAL_OINTMENT | Freq: Two times a day (BID) | CUTANEOUS | Status: DC
  Filled 2020-02-10: qty 14

## 2020-02-10 MED FILL — Nitroglycerin IV Soln 100 MCG/ML in D5W: INTRA_ARTERIAL | Qty: 10 | Status: AC

## 2020-02-10 NOTE — Progress Notes (Signed)
Subjective:  Denies SSCP, palpitations or Dyspnea Tired did not sleep well  Objective:  Vitals:   02/10/20 0500 02/10/20 0600 02/10/20 0700 02/10/20 0756  BP: (!) 96/55 112/78 122/69   Pulse:      Resp: 15 14 20    Temp:    98 F (36.7 C)  TempSrc:    Oral  SpO2:      Weight: 77.1 kg     Height:        Intake/Output from previous day:  Intake/Output Summary (Last 24 hours) at 02/10/2020 2637 Last data filed at 02/10/2020 0600 Gross per 24 hour  Intake 20 ml  Output 790 ml  Net -770 ml    Physical Exam: Affect appropriate Healthy:  appears stated age HEENT: normal Neck supple with no adenopathy JVP normal no bruits no thyromegaly Lungs clear with no wheezing and good diaphragmatic motion Heart:  S1/S2 no murmur, no rub, gallop or click PMI normal Abdomen: benighn, BS positve, no tenderness, no AAA no bruit.  No HSM or HJR Distal pulses intact with no bruits No edema Neuro non-focal Skin warm and dry No muscular weakness Right radial artery A  Lab Results: Basic Metabolic Panel: Recent Labs    02/09/20 1411 02/09/20 1948  NA 136  --   K 3.9  --   CL 102  --   CO2 26  --   GLUCOSE 109*  --   BUN <5*  --   CREATININE 0.60 0.41*  CALCIUM 9.1  --    Liver Function Tests: No results for input(s): AST, ALT, ALKPHOS, BILITOT, PROT, ALBUMIN in the last 72 hours. No results for input(s): LIPASE, AMYLASE in the last 72 hours. CBC: Recent Labs    02/09/20 1948 02/10/20 0659  WBC 4.5 5.3  HGB 9.9* 12.1  HCT 31.1* 37.1  MCV 90.1 90.7  PLT 210 260   Cardiac Enzymes: No results for input(s): CKTOTAL, CKMB, CKMBINDEX, TROPONINI in the last 72 hours. BNP: Invalid input(s): POCBNP D-Dimer: No results for input(s): DDIMER in the last 72 hours. Hemoglobin A1C: Recent Labs    02/09/20 1948  HGBA1C 5.6   Fasting Lipid Panel: No results for input(s): CHOL, HDL, LDLCALC, TRIG, CHOLHDL, LDLDIRECT in the last 72 hours. Thyroid Function Tests: Recent  Labs    02/09/20 1948  TSH 0.466   Anemia Panel: No results for input(s): VITAMINB12, FOLATE, FERRITIN, TIBC, IRON, RETICCTPCT in the last 72 hours.  Imaging: DG Chest 2 View  Result Date: 02/09/2020 CLINICAL DATA:  44 year old female with history of chest pain. EXAM: CHEST - 2 VIEW COMPARISON:  No priors. FINDINGS: Lung volumes are normal. No consolidative airspace disease. No pleural effusions. No pneumothorax. No pulmonary nodule or mass noted. Pulmonary vasculature and the cardiomediastinal silhouette are within normal limits. Sclerotic lesion in the left proximal humerus measuring approximately 3.3 x 2.7 cm. IMPRESSION: 1. No radiographic evidence of acute cardiopulmonary disease. Mixed lucent and sclerotic lesion in the proximal left humerus, which may be chondroid. 2. 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 Electronically Signed   By: Vinnie Langton M.D.   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  Patient is asymptomatic at the time of cath.  Normal left main  Normal LAD  Otherwise normal circumflex  Dominant normal right coronary artery  Normal LV systolic function and end-diastolic pressure. RECOMMENDATIONS:  Beta-blocker therapy and blood pressure control to less than 120/80 mmHg.  Renin angiotensin blockade or calcium channel blocker therapy would be considerations in addition to beta-blocker if needed.  Aspirin and Plavix for least 6 months.  Statin therapy  Avoid full anticoagulation.  Consider screening carotid and renal arteries for evidence of fibromuscular dysplasia  We will check hs-CRP and sed rate.  ECHOCARDIOGRAM COMPLETE  Result Date: 02/09/2020     ECHOCARDIOGRAM REPORT   Patient Name:   Stefanie [name] Date of Exam: 02/09/2020 Medical Rec #:  [id number]        Height:       70.0 in Accession #:    [id number]       Weight:       168.0 lb Date of Birth:  [birth year]         BSA:          1.938 m Patient Age:    44 years         BP:           129/71 mmHg Patient Gender: F                HR:           76 bpm. Exam Location:  Inpatient Procedure: 2D Echo and Strain Analysis Indications:    Chest Pain 786.50 / R07.9  History:        Patient has no prior history of Echocardiogram examinations. No                 cardiac history.  Sonographer:    Darlina Sicilian RDCS Referring Phys: [id number] [place]  1. Left ventricular ejection fraction, by estimation, is 55 to 60%. The left ventricle has normal function. The left ventricle has no regional wall motion abnormalities. Left ventricular diastolic parameters were normal.  2. Right ventricular systolic function is normal. The right ventricular size is normal.  3. The mitral valve is normal in structure. Trivial mitral valve regurgitation. No evidence of mitral stenosis.  4. The aortic valve is tricuspid. Aortic valve regurgitation is not visualized. No aortic stenosis is present.  5. The inferior vena cava is normal in size with greater than 50% respiratory variability, suggesting right atrial pressure of 3 mmHg. FINDINGS  Left Ventricle: Left ventricular ejection fraction, by estimation, is 55 to 60%. The left ventricle has normal function. The left ventricle has no regional wall motion abnormalities. The left ventricular internal cavity size was normal in size. There is  no left ventricular hypertrophy. Left ventricular diastolic parameters were normal. Right Ventricle: The right ventricular size is normal.Right ventricular systolic function is normal. Left Atrium: Left atrial size was normal in size. Right Atrium: Right atrial size was normal in size. Pericardium: There is no evidence of pericardial  effusion. Mitral Valve: The mitral valve is normal in structure. Normal mobility of the mitral valve leaflets. Trivial mitral valve regurgitation. No evidence of mitral valve stenosis. Tricuspid Valve: The tricuspid valve is normal in structure. Tricuspid valve regurgitation is trivial. No evidence of tricuspid stenosis. Aortic Valve: The aortic valve is tricuspid. Aortic valve regurgitation is not visualized. No aortic stenosis is present. Pulmonic Valve: The pulmonic valve was normal in structure. Pulmonic valve regurgitation is trivial. No evidence of pulmonic stenosis. Aorta: The aortic root is normal in size and structure. Venous: The inferior  vena cava is normal in size with greater than 50% respiratory variability, suggesting right atrial pressure of 3 mmHg.  LEFT VENTRICLE PLAX 2D LVOT diam:     2.00 cm  Diastology LV SV:         58       LV e' lateral:   13.70 cm/s LV SV Index:   30       LV E/e' lateral: 5.0 LVOT Area:     3.14 cm LV e' medial:    10.30 cm/s                         LV E/e' medial:  6.7  RIGHT VENTRICLE RV S prime:     15.30 cm/s TAPSE (M-mode): 2.5 cm LEFT ATRIUM             Index       RIGHT ATRIUM           Index LA Vol (A2C):   33.0 ml 17.03 ml/m RA Area:     15.20 cm LA Vol (A4C):   66.2 ml 34.15 ml/m RA Volume:   40.10 ml  20.69 ml/m LA Biplane Vol: 51.2 ml 26.42 ml/m  AORTIC VALVE LVOT Vmax:   87.50 cm/s LVOT Vmean:  61.700 cm/s LVOT VTI:    0.184 m  AORTA Ao Root diam: 3.20 cm MITRAL VALVE MV Area (PHT): 3.72 cm    SHUNTS MV Decel Time: 204 msec    Systemic VTI:  0.18 m MV E velocity: 68.60 cm/s  Systemic Diam: 2.00 cm MV A velocity: 51.00 cm/s MV E/A ratio:  1.35 Kirk Ruths MD Electronically signed by Kirk Ruths MD Signature Date/Time: 02/09/2020/4:51:45 PM    Final     Cardiac Studies:  ECG: NSR normal now    Telemetry: NSR   Echo: EF 55-60% no valve disease no effusion   Medications:   . aspirin  81 mg Oral Daily  . atorvastatin  80 mg Oral Daily  .  Chlorhexidine Gluconate Cloth  6 each Topical Q0600  . clopidogrel  75 mg Oral Q breakfast  . heparin  5,000 Units Subcutaneous Q8H  . levothyroxine  100 mcg Oral QAC breakfast  . melatonin  3 mg Oral QHS  . metoprolol tartrate  12.5 mg Oral BID  . sodium chloride flush  3 mL Intravenous Q12H     . sodium chloride      Assessment/Plan:   1. 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 Peak troponin 5591. Continue ASA/Plavix can ambulate and d/c heparin can likely lower statin dose once lipids back Watch in hospital 48 hours continue beta blocker  2. Thyroid:  Continue replacement TSH normal   Jenkins Rouge 02/10/2020, 8:11 AM

## 2020-02-11 MED ORDER — ATORVASTATIN CALCIUM 10 MG PO TABS
10.0000 mg | ORAL_TABLET | Freq: Every day | ORAL | Status: DC
  Administered 2020-02-12: 10 mg via ORAL
  Filled 2020-02-11: qty 1

## 2020-02-11 NOTE — Progress Notes (Signed)
Subjective:  Minimal atypical sharp pains Mild dizziness with ambulation   Objective:  Vitals:   02/11/20 0600 02/11/20 0700 02/11/20 0801 02/11/20 0842  BP:    110/62  Pulse:    71  Resp: 12 18    Temp:   98.3 F (36.8 C)   TempSrc:   Oral   SpO2:      Weight:      Height:        Intake/Output from previous day: No intake or output data in the 24 hours ending 02/11/20 0906  Physical Exam: Affect appropriate Healthy:  appears stated age HEENT: normal Neck supple with no adenopathy JVP normal no bruits no thyromegaly Lungs clear with no wheezing and good diaphragmatic motion Heart:  S1/S2 no murmur, no rub, gallop or click PMI normal Abdomen: benighn, BS positve, no tenderness, no AAA no bruit.  No HSM or HJR Distal pulses intact with no bruits No edema Neuro non-focal Skin warm and dry No muscular weakness Right radial artery A  Lab Results: Basic Metabolic Panel: Recent Labs    02/09/20 1411 02/09/20 1411 02/09/20 1948 02/10/20 0659  NA 136  --   --  137  K 3.9  --   --  3.9  CL 102  --   --  106  CO2 26  --   --  24  GLUCOSE 109*  --   --  96  BUN <5*  --   --  5*  CREATININE 0.60   < > 0.41* 0.60  CALCIUM 9.1  --   --  9.1   < > = values in this interval not displayed.   Liver Function Tests: No results for input(s): AST, ALT, ALKPHOS, BILITOT, PROT, ALBUMIN in the last 72 hours. No results for input(s): LIPASE, AMYLASE in the last 72 hours. CBC: Recent Labs    02/09/20 1948 02/10/20 0659  WBC 4.5 5.3  HGB 9.9* 12.1  HCT 31.1* 37.1  MCV 90.1 90.7  PLT 210 260   Cardiac Enzymes: No results for input(s): CKTOTAL, CKMB, CKMBINDEX, TROPONINI in the last 72 hours. BNP: Invalid input(s): POCBNP D-Dimer: No results for input(s): DDIMER in the last 72 hours. Hemoglobin A1C: Recent Labs    02/09/20 1948  HGBA1C 5.6   Fasting Lipid Panel: Recent Labs    02/10/20 0659  CHOL 155  HDL 51  LDLCALC 96  TRIG 42  CHOLHDL 3.0    Thyroid Function Tests: Recent Labs    02/09/20 1948  TSH 0.466   Anemia Panel: No results for input(s): VITAMINB12, FOLATE, FERRITIN, TIBC, IRON, RETICCTPCT in the last 72 hours.  Imaging: DG Chest 2 View  Result Date: 02/09/2020 CLINICAL DATA:  44 year old female with history of chest pain. EXAM: CHEST - 2 VIEW COMPARISON:  No priors. FINDINGS: Lung volumes are normal. No consolidative airspace disease. No pleural effusions. No pneumothorax. No pulmonary nodule or mass noted. Pulmonary vasculature and the cardiomediastinal silhouette are within normal limits. Sclerotic lesion in the left proximal humerus measuring approximately 3.3 x 2.7 cm. IMPRESSION: 1. No radiographic evidence of acute cardiopulmonary disease. Mixed lucent and sclerotic lesion in the proximal left humerus, which may be chondroid. 2. 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 Electronically Signed   By: Vinnie Langton M.D.   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  Patient is asymptomatic at the time of cath.  Normal left main  Normal LAD  Otherwise normal circumflex  Dominant normal right coronary artery  Normal LV systolic function and end-diastolic pressure. RECOMMENDATIONS:  Beta-blocker therapy and blood pressure control to less than 120/80 mmHg.  Renin angiotensin blockade or calcium channel blocker therapy would be considerations in addition to beta-blocker if needed.  Aspirin and Plavix for least 6 months.  Statin therapy  Avoid full anticoagulation.  Consider screening carotid and renal arteries for evidence of fibromuscular dysplasia  We will check hs-CRP and sed rate.  ECHOCARDIOGRAM  COMPLETE  Result Date: 02/09/2020    ECHOCARDIOGRAM REPORT   Patient Name:   Stefanie [name] Date of Exam: 02/09/2020 Medical Rec #:  [id number]        Height:       70.0 in Accession #:    [id number]       Weight:       168.0 lb Date of Birth:  01-02-1976         BSA:          1.938 m Patient Age:    44 years         BP:           129/71 mmHg Patient Gender: F                HR:           76 bpm. Exam Location:  Inpatient Procedure: 2D Echo and Strain Analysis Indications:    Chest Pain 786.50 / R07.9  History:        Patient has no prior history of Echocardiogram examinations. No                 cardiac history.  Sonographer:    Darlina Sicilian RDCS Referring Phys: [id number] [place]  1. Left ventricular ejection fraction, by estimation, is 55 to 60%. The left ventricle has normal function. The left ventricle has no regional wall motion abnormalities. Left ventricular diastolic parameters were normal.  2. Right ventricular systolic function is normal. The right ventricular size is normal.  3. The mitral valve is normal in structure. Trivial mitral valve regurgitation. No evidence of mitral stenosis.  4. The aortic valve is tricuspid. Aortic valve regurgitation is not visualized. No aortic stenosis is present.  5. The inferior vena cava is normal in size with greater than 50% respiratory variability, suggesting right atrial pressure of 3 mmHg. FINDINGS  Left Ventricle: Left ventricular ejection fraction, by estimation, is 55 to 60%. The left ventricle has normal function. The left ventricle has no regional wall motion abnormalities. The left ventricular internal cavity size was normal in size. There is  no left ventricular hypertrophy. Left ventricular diastolic parameters were normal. Right Ventricle: The right ventricular size is normal.Right ventricular systolic function is normal. Left Atrium: Left atrial size was normal in size. Right Atrium: Right atrial size was normal in size.  Pericardium: There is no evidence of pericardial effusion. Mitral Valve: The mitral valve is normal in structure. Normal mobility of the mitral valve leaflets. Trivial mitral valve regurgitation. No evidence of mitral valve stenosis. Tricuspid Valve: The tricuspid valve is normal in structure. Tricuspid valve regurgitation is trivial. No evidence of tricuspid stenosis. Aortic Valve: The aortic valve is tricuspid. Aortic valve regurgitation is not visualized. No aortic stenosis is  present. Pulmonic Valve: The pulmonic valve was normal in structure. Pulmonic valve regurgitation is trivial. No evidence of pulmonic stenosis. Aorta: The aortic root is normal in size and structure. Venous: The inferior vena cava is normal in size with greater than 50% respiratory variability, suggesting right atrial pressure of 3 mmHg.  LEFT VENTRICLE PLAX 2D LVOT diam:     2.00 cm  Diastology LV SV:         58       LV e' lateral:   13.70 cm/s LV SV Index:   30       LV E/e' lateral: 5.0 LVOT Area:     3.14 cm LV e' medial:    10.30 cm/s                         LV E/e' medial:  6.7  RIGHT VENTRICLE RV S prime:     15.30 cm/s TAPSE (M-mode): 2.5 cm LEFT ATRIUM             Index       RIGHT ATRIUM           Index LA Vol (A2C):   33.0 ml 17.03 ml/m RA Area:     15.20 cm LA Vol (A4C):   66.2 ml 34.15 ml/m RA Volume:   40.10 ml  20.69 ml/m LA Biplane Vol: 51.2 ml 26.42 ml/m  AORTIC VALVE LVOT Vmax:   87.50 cm/s LVOT Vmean:  61.700 cm/s LVOT VTI:    0.184 m  AORTA Ao Root diam: 3.20 cm MITRAL VALVE MV Area (PHT): 3.72 cm    SHUNTS MV Decel Time: 204 msec    Systemic VTI:  0.18 m MV E velocity: 68.60 cm/s  Systemic Diam: 2.00 cm MV A velocity: 51.00 cm/s MV E/A ratio:  1.35 Kirk Ruths MD Electronically signed by Kirk Ruths MD Signature Date/Time: 02/09/2020/4:51:45 PM    Final     Cardiac Studies:  ECG: NSR normal now    Telemetry: NSR   Echo: EF 55-60% no valve disease no effusion   Medications:    aspirin  81 mg Oral  Daily   atorvastatin  80 mg Oral Daily   Chlorhexidine Gluconate Cloth  6 each Topical Q0600   clopidogrel  75 mg Oral Q breakfast   levothyroxine  100 mcg Oral QAC breakfast   melatonin  3 mg Oral QHS   metoprolol tartrate  12.5 mg Oral BID   neomycin-bacitracin-polymyxin   Topical BID   sodium chloride flush  3 mL Intravenous Q12H      sodium chloride      Assessment/Plan:   1. 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 Peak troponin 5591. Continue ASA/Plavix  LDL only 96 will change lipitor to 10 mg daily continue low dose lopressor at risk myocardial rupture with circumflex infarct/OM and normal hyperdynamic EF else where Transfer to floor Tentative d/c in am   2. Thyroid:  Continue replacement TSH normal 0.466  Jenkins Rouge 02/11/2020, 9:06 AM

## 2020-02-11 NOTE — Progress Notes (Signed)
Patient report given from 2 heart Aaron Edelman, RN).. Patient brought up to floor on cardiac telemetry and in good spirits in wheel chair.  Patient denies any chest pain, or dyspnea. Patient assessment is clear and there are no skin issues.  Patient is ambulatory without difficulty.  Patient showed call bell, bed is in low and locked position.  Patient understands how to use all equipment in room.

## 2020-02-11 NOTE — Discharge Summary (Signed)
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  Patient is asymptomatic at the time of cath.  Normal left main  Normal LAD  Otherwise normal circumflex  Dominant normal right coronary artery  Normal LV systolic function and end-diastolic pressure.  RECOMMENDATIONS:   Beta-blocker therapy and blood pressure control to less than 120/80 mmHg.  Renin angiotensin blockade or calcium channel blocker therapy would be considerations in addition to beta-blocker if needed.  Aspirin and Plavix for least 6 months.  Statin therapy  Avoid full anticoagulation.  Consider screening carotid and renal arteries for evidence of fibromuscular dysplasia  We will check hs-CRP and sed rate.  Diagnostic Dominance: Right  Intervention    Echocardiogram 02/09/20:  1. Left ventricular ejection fraction, by estimation, is 55 to 60%. The  left ventricle has normal function. The left ventricle has no regional  wall motion abnormalities. Left ventricular diastolic parameters were  normal.  2. Right ventricular systolic function is normal. The right ventricular  size is normal.  3. The mitral valve is normal in structure. Trivial mitral valve  regurgitation. No evidence of mitral stenosis.  4. The aortic valve is tricuspid. Aortic valve regurgitation is not  visualized. No aortic stenosis is present.   5. The inferior vena cava is normal in size with greater than 50%  respiratory variability, suggesting right atrial pressure of 3 mmHg.    History of Present Illness     Stefanie [name] is a 44 y.o. female with a history of thyroid disease.   Stefanie [name] has no prior cardiac history. She developed chest pain 02/06/20. She described the chest pain as a pressure/elephant sitting on her chest. CP was relieved with tylenol. She had a recurrence of chest pain on one day later and was pain free by Sunday. CP then returned on day of presentation therefore she called her PCP who instructed her to proceed to the ED further evaluation. Pain progressed and she called EMS. She was given 324 mg ASA and pain relieved from 10/10 to 2/10. ECG with EMS showed some diffuse ST depression ECG in ER normal with no ST elevation or signs of pericarditis. hsT found to be elevate dat 5075>>>5591. VSS on presentation. CXR no CE normal mediastinum and aortic root normal on echo. Bedside echo performed with no effusion, normal EF at 65-70% and no significant valve disease. She reported having her 2nd COVID  vaccine in April. No other constitutional / URI symptoms. No significant family history. No drugs/cocaine / stimulants   Hospital Course   She then underwent LHC on 02/09/20 which showed a Type II spontaneous coronary artery dissection of the large first obtuse marginal with segmental 50% irregular narrowing but maintained TIMI grade III flow.  Normal left main, normal LAD and otherwise normal circumflex. There was a dominant normal right coronary artery. Normal LV systolic function and end-diastolic pressure.  RECOMMENDATIONS:   Beta-blocker therapy and blood pressure control to less than 120/80  mmHg.  Renin angiotensin blockade or calcium channel blocker therapy would be considerations in addition to beta-blocker if needed.  Aspirin and Plavix for least 6 months.  Statin therapy  Avoid full  anticoagulation.  Consider screening carotid and renal arteries for evidence of fibromuscular dysplasia  We will check hs-CRP and sed rate.   LDL found to be 96 therefore atorvastatin reduced to 10mg  PO QD. She was continued on beta-blocker therapy.   Plan:  SEMI:   -SCAD involving large OM with good flow TIMI 3.  -Peak troponin 5591.  -Continue ASA/Plavix   -LDL only 96 will change lipitor to 10 mg daily  -Continue low dose lopressor at risk myocardial rupture with circumflex infarct/OM and normal hyperdynamic EF else where   Thyroid:   -Continue replacement TSH normal 0.466  Left humerus lesion: -CXR completed which did show a mixed lucent and sclerotic lesion in the proximal left humerus, which may be chondroid.  -Felt to represent an enchondroma, however, dedicated left humerus radiographs are recommended, particularly if there is any history of unexplained shoulder pain. Stefanie [name] needs follow up with PCP for this   Consultants: None   The patient has been seen and examined by Dr. Johnsie Cancel who feels that she is stable and ready for discharge today, 02/12/20   Did the patient have an acute coronary syndrome (MI, NSTEMI, STEMI, etc) this admission?:  No.   The elevated Troponin was due to the acute medical illness (demand ischemia).  _____________  Discharge Vitals Blood pressure 115/72, pulse 64, temperature 97.8 F (36.6 C), temperature source Oral, resp. rate 18, height 5\' 10"  (1.778 m), weight 73.3 kg, last menstrual period 02/01/2020, SpO2 100 %.  Filed Weights   02/10/20 0500 02/11/20 0500 02/12/20 0553  Weight: 77.1 kg 73.1 kg 73.3 kg   Labs & Radiologic Studies    CBC Recent Labs    02/09/20 1948 02/10/20 0659  WBC 4.5 5.3  HGB 9.9* 12.1  HCT 31.1* 37.1  MCV 90.1 90.7  PLT 210 993   Basic Metabolic Panel Recent Labs    02/09/20 1411 02/09/20 1411 02/09/20 1948 02/10/20 0659  NA 136  --   --  137  K 3.9  --   --  3.9  CL 102  --   --  106  CO2 26  --    --  24  GLUCOSE 109*  --   --  96  BUN <5*  --   --  5*  CREATININE 0.60   < > 0.41* 0.60  CALCIUM 9.1  --   --  9.1   < > = values in this interval not displayed.   Liver Function Tests No results for input(s): AST, ALT, ALKPHOS, BILITOT, PROT, ALBUMIN in the last 72 hours. No results for input(s): LIPASE, AMYLASE in the last 72 hours. High Sensitivity Troponin:   Recent Labs  Lab 02/09/20 1411 02/09/20 1627 02/09/20 1948 02/09/20 2155  TROPONINIHS 2,457* 3,762* 5,075* 5,591*    BNP Invalid input(s): POCBNP D-Dimer No results for input(s): DDIMER in the last 72 hours. Hemoglobin A1C Recent Labs    02/09/20 1948  HGBA1C 5.6   Fasting Lipid Panel Recent Labs    02/10/20 0659  CHOL 155  HDL 51  LDLCALC 96  TRIG 42  CHOLHDL 3.0   Thyroid Function Tests Recent Labs    02/09/20 1948  TSH 0.466   _____________  DG Chest 2 View  Result Date: 02/09/2020 CLINICAL DATA:  44 year old female with  history of chest pain. EXAM: CHEST - 2 VIEW COMPARISON:  No priors. FINDINGS: Lung volumes are normal. No consolidative airspace disease. No pleural effusions. No pneumothorax. No pulmonary nodule or mass noted. Pulmonary vasculature and the cardiomediastinal silhouette are within normal limits. Sclerotic lesion in the left proximal humerus measuring approximately 3.3 x 2.7 cm. IMPRESSION: 1. No radiographic evidence of acute cardiopulmonary disease. Mixed lucent and sclerotic lesion in the proximal left humerus, which may be chondroid. 2. 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 Electronically Signed   By: Vinnie Langton M.D.   On: 02/09/2020 14:48   CARDIAC CATHETERIZATION  Result Date: 02/09/2020  Type II spontaneous coronary artery dissection of the large first obtuse marginal with segmental 50% irregular narrowing but maintained TIMI grade III  flow.  Patient is asymptomatic at the time of cath.  Normal left main  Normal LAD  Otherwise normal circumflex  Dominant normal right coronary artery  Normal LV systolic function and end-diastolic pressure. RECOMMENDATIONS:  Beta-blocker therapy and blood pressure control to less than 120/80 mmHg.  Renin angiotensin blockade or calcium channel blocker therapy would be considerations in addition to beta-blocker if needed.  Aspirin and Plavix for least 6 months.  Statin therapy  Avoid full anticoagulation.  Consider screening carotid and renal arteries for evidence of fibromuscular dysplasia  We will check hs-CRP and sed rate.  ECHOCARDIOGRAM COMPLETE  Result Date: 02/09/2020    ECHOCARDIOGRAM REPORT   Patient Name:   Stefanie [name] Date of Exam: 02/09/2020 Medical Rec #:  [id number]        Height:       70.0 in Accession #:    [id number]       Weight:       168.0 lb Date of Birth:  Jan 12, 1976         BSA:          1.938 m Patient Age:    52 years         BP:           129/71 mmHg Patient Gender: F                HR:           76 bpm. Exam Location:  Inpatient Procedure: 2D Echo and Strain Analysis Indications:    Chest Pain 786.50 / R07.9  History:        Patient has no prior history of Echocardiogram examinations. No                 cardiac history.  Sonographer:    Darlina Sicilian RDCS Referring Phys: [id number] [place]  1. Left ventricular ejection fraction, by estimation, is 55 to 60%. The left ventricle has normal function. The left ventricle has no regional wall motion abnormalities. Left ventricular diastolic parameters were normal.  2. Right ventricular systolic function is normal. The right ventricular size is normal.  3. The mitral valve is normal in structure. Trivial mitral valve regurgitation. No evidence of mitral stenosis.  4. The aortic valve is tricuspid. Aortic valve regurgitation is not visualized. No aortic stenosis is present.  5. The inferior vena cava is normal  in size with greater than 50% respiratory variability, suggesting right atrial pressure of 3 mmHg. FINDINGS  Left Ventricle: Left ventricular ejection fraction, by estimation, is 55 to 60%. The left ventricle has normal function. The left  ventricle has no regional wall motion abnormalities. The left ventricular internal cavity size was normal in size. There is  no left ventricular hypertrophy. Left ventricular diastolic parameters were normal. Right Ventricle: The right ventricular size is normal.Right ventricular systolic function is normal. Left Atrium: Left atrial size was normal in size. Right Atrium: Right atrial size was normal in size. Pericardium: There is no evidence of pericardial effusion. Mitral Valve: The mitral valve is normal in structure. Normal mobility of the mitral valve leaflets. Trivial mitral valve regurgitation. No evidence of mitral valve stenosis. Tricuspid Valve: The tricuspid valve is normal in structure. Tricuspid valve regurgitation is trivial. No evidence of tricuspid stenosis. Aortic Valve: The aortic valve is tricuspid. Aortic valve regurgitation is not visualized. No aortic stenosis is present. Pulmonic Valve: The pulmonic valve was normal in structure. Pulmonic valve regurgitation is trivial. No evidence of pulmonic stenosis. Aorta: The aortic root is normal in size and structure. Venous: The inferior vena cava is normal in size with greater than 50% respiratory variability, suggesting right atrial pressure of 3 mmHg.  LEFT VENTRICLE PLAX 2D LVOT diam:     2.00 cm  Diastology LV SV:         58       LV e' lateral:   13.70 cm/s LV SV Index:   30       LV E/e' lateral: 5.0 LVOT Area:     3.14 cm LV e' medial:    10.30 cm/s                         LV E/e' medial:  6.7  RIGHT VENTRICLE RV S prime:     15.30 cm/s TAPSE (M-mode): 2.5 cm LEFT ATRIUM             Index       RIGHT ATRIUM           Index LA Vol (A2C):   33.0 ml 17.03 ml/m RA Area:     15.20 cm LA Vol (A4C):   66.2 ml 34.15  ml/m RA Volume:   40.10 ml  20.69 ml/m LA Biplane Vol: 51.2 ml 26.42 ml/m  AORTIC VALVE LVOT Vmax:   87.50 cm/s LVOT Vmean:  61.700 cm/s LVOT VTI:    0.184 m  AORTA Ao Root diam: 3.20 cm MITRAL VALVE MV Area (PHT): 3.72 cm    SHUNTS MV Decel Time: 204 msec    Systemic VTI:  0.18 m MV E velocity: 68.60 cm/s  Systemic Diam: 2.00 cm MV A velocity: 51.00 cm/s MV E/A ratio:  1.35 Kirk Ruths MD Electronically signed by Kirk Ruths MD Signature Date/Time: 02/09/2020/4:51:45 PM    Final    Disposition   Stefanie [name] is being discharged home today in good condition.  Follow-up Plans & Appointments    Follow-up Information    Imogene Burn, PA-C Follow up on 02/24/2020.   Specialty: Cardiology Why: at 8:15am  Contact information: [address] [place] [address] [phone]              Discharge Instructions    Call MD for:  difficulty breathing, headache or visual disturbances   Complete by: As directed    Call MD for:  extreme fatigue   Complete by: As directed    Call MD for:  hives   Complete by: As directed    Call MD for:  persistant dizziness or light-headedness   Complete by: As  directed    Call MD for:  persistant nausea and vomiting   Complete by: As directed    Call MD for:  redness, tenderness, or signs of infection (pain, swelling, redness, odor or green/yellow discharge around incision site)   Complete by: As directed    Call MD for:  severe uncontrolled pain   Complete by: As directed    Call MD for:  temperature >100.4   Complete by: As directed    Diet - low sodium heart healthy   Complete by: As directed    Discharge instructions   Complete by: As directed    No driving for 3 days. No lifting over 5 lbs for 1 week. No sexual activity for 1 week. Keep procedure site clean & dry. If you notice increased pain, swelling, bleeding or pus, call/return!  You may shower, but no soaking baths/hot tubs/pools for 1 week.   Increase activity slowly    Complete by: As directed       Discharge Medications   Allergies as of 02/12/2020      Reactions   Fosamax [alendronate] Other (See Comments)   stomachache   Latex Itching   Reclast [zoledronic Acid] Rash      Medication List    TAKE these medications   aspirin 81 MG chewable tablet Chew 1 tablet (81 mg total) by mouth daily. Start taking on: February 13, 2020   atorvastatin 10 MG tablet Commonly known as: LIPITOR Take 1 tablet (10 mg total) by mouth daily. Start taking on: February 13, 2020   clopidogrel 75 MG tablet Commonly known as: PLAVIX Take 1 tablet (75 mg total) by mouth daily with breakfast. Start taking on: February 13, 2020   levothyroxine 100 MCG tablet Commonly known as: SYNTHROID Take 100 mcg by mouth daily before breakfast.   metoprolol tartrate 25 MG tablet Commonly known as: LOPRESSOR Take 0.5 tablets (12.5 mg total) by mouth 2 (two) times daily.   MULTIVITAMIN ADULT PO Take 1 tablet by mouth daily.   nitroGLYCERIN 0.4 MG SL tablet Commonly known as: NITROSTAT Place 1 tablet (0.4 mg total) under the tongue every 5 (five) minutes x 3 doses as needed for chest pain.   Vitamin D (Ergocalciferol) 1.25 MG (50000 UNIT) Caps capsule Commonly known as: DRISDOL Take 50,000 Units by mouth once a week. Wednesdays       Outstanding Labs/Studies   None  Duration of Discharge Encounter   Greater than 30 minutes including physician time.  Signed, Kathyrn Drown, NP 02/12/2020, 10:44 AM

## 2020-02-12 DIAGNOSIS — I2542 Coronary artery dissection: Secondary | ICD-10-CM

## 2020-02-12 DIAGNOSIS — M899 Disorder of bone, unspecified: Secondary | ICD-10-CM

## 2020-02-12 LAB — HIGH SENSITIVITY CRP: CRP, High Sensitivity: 1.06 mg/L (ref 0.00–3.00)

## 2020-02-12 MED ORDER — ATORVASTATIN CALCIUM 10 MG PO TABS: 10.0000 mg | ORAL_TABLET | Freq: Every day | ORAL | 2 refills | Status: DC

## 2020-02-12 MED ORDER — NITROGLYCERIN 0.4 MG SL SUBL: 0.4000 mg | SUBLINGUAL_TABLET | SUBLINGUAL | 0 refills | Status: AC | PRN

## 2020-02-12 MED ORDER — ASPIRIN 81 MG PO CHEW: 81.0000 mg | CHEWABLE_TABLET | Freq: Every day | ORAL | Status: AC

## 2020-02-12 MED ORDER — METOPROLOL TARTRATE 25 MG PO TABS: 12.5000 mg | ORAL_TABLET | Freq: Two times a day (BID) | ORAL | 3 refills | Status: DC

## 2020-02-12 MED ORDER — CLOPIDOGREL BISULFATE 75 MG PO TABS: 75.0000 mg | ORAL_TABLET | Freq: Every day | ORAL | 2 refills | Status: DC

## 2020-02-12 MED FILL — CLOPIDOGREL 75 MG TABLET: 75 | 30 days supply | Qty: 30 | Fill #0

## 2020-02-12 MED FILL — METOPROLOL TARTRATE 25 MG T: 25 | 30 days supply | Qty: 30 | Fill #0

## 2020-02-12 MED FILL — NITROGLYCERIN 0.4 MG TAB SL: 0.4 | 7 days supply | Qty: 25 | Fill #0

## 2020-02-12 MED FILL — ATORVASTATIN CALCIUM 10 MG: 10 | 30 days supply | Qty: 30 | Fill #0

## 2020-02-12 NOTE — Progress Notes (Signed)
Subjective:  No chest pain Best night sleep yet   Objective:  Vitals:   02/11/20 1608 02/11/20 1700 02/11/20 2223 02/12/20 0553  BP: (!) 108/58  113/64 115/72  Pulse: 67  68 64  Resp: 15 19 16 18   Temp:   98.6 F (37 C) 97.8 F (36.6 C)  TempSrc:   Oral Oral  SpO2: 100%  100% 100%  Weight:    73.3 kg  Height:        Intake/Output from previous day:  Intake/Output Summary (Last 24 hours) at 02/12/2020 0945 Last data filed at 02/11/2020 2224 Gross per 24 hour  Intake 483 ml  Output --  Net 483 ml    Physical Exam: Affect appropriate Healthy:  appears stated age HEENT: normal Neck supple with no adenopathy JVP normal no bruits no thyromegaly Lungs clear with no wheezing and good diaphragmatic motion Heart:  S1/S2 no murmur, no rub, gallop or click PMI normal Abdomen: benighn, BS positve, no tenderness, no AAA no bruit.  No HSM or HJR Distal pulses intact with no bruits No edema Neuro non-focal Skin warm and dry No muscular weakness Right radial artery A  Lab Results: Basic Metabolic Panel: Recent Labs    02/09/20 1411 02/09/20 1411 02/09/20 1948 02/10/20 0659  NA 136  --   --  137  K 3.9  --   --  3.9  CL 102  --   --  106  CO2 26  --   --  24  GLUCOSE 109*  --   --  96  BUN <5*  --   --  5*  CREATININE 0.60   < > 0.41* 0.60  CALCIUM 9.1  --   --  9.1   < > = values in this interval not displayed.   Liver Function Tests: No results for input(s): AST, ALT, ALKPHOS, BILITOT, PROT, ALBUMIN in the last 72 hours. No results for input(s): LIPASE, AMYLASE in the last 72 hours. CBC: Recent Labs    02/09/20 1948 02/10/20 0659  WBC 4.5 5.3  HGB 9.9* 12.1  HCT 31.1* 37.1  MCV 90.1 90.7  PLT 210 260   Hemoglobin A1C: Recent Labs    02/09/20 1948  HGBA1C 5.6   Fasting Lipid Panel: Recent Labs    02/10/20 0659  CHOL 155  HDL 51  LDLCALC 96  TRIG 42  CHOLHDL 3.0   Thyroid Function Tests: Recent Labs    02/09/20 1948  TSH 0.466     Imaging: No results found.  Cardiac Studies:  ECG: NSR normal now    Telemetry: NSR   Echo: EF 55-60% no valve disease no effusion   Medications:    aspirin  81 mg Oral Daily   atorvastatin  10 mg Oral Daily   Chlorhexidine Gluconate Cloth  6 each Topical Q0600   clopidogrel  75 mg Oral Q breakfast   levothyroxine  100 mcg Oral QAC breakfast   melatonin  3 mg Oral QHS   metoprolol tartrate  12.5 mg Oral BID   neomycin-bacitracin-polymyxin   Topical BID   sodium chloride flush  3 mL Intravenous Q12H      sodium chloride      Assessment/Plan:   1. 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 Peak troponin 5591. Continue ASA/Plavix  LDL only 96 will change lipitor to 10 mg daily continue low dose lopressor at risk myocardial rupture with circumflex infarct/OM and normal hyperdynamic EF else where  D/c home today   2. Thyroid:  Continue replacement TSH normal 0.466  Jenkins Rouge 02/12/2020, 9:45 AM

## 2020-02-23 NOTE — Progress Notes (Signed)
Cardiology Office Note    Date:  02/24/2020   ID:  Stefanie [name], DOB March 04, 1976, MRN [id number]  PCP:  Steele Sizer, MD  Cardiologist: Jenkins Rouge, MD EPS: None  Chief Complaint  Patient presents with  . Follow-up    History of Present Illness:  Stefanie [name] is a 44 y.o. 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  Normal left main, LAD and otherwise normal circumflex normal RCA and LV function.  Recommended beta-blocker therapy and blood pressure control to less than 120/80.  Plavix and aspirin for at least 6 months, statin therapy, avoid full anticoagulation, consider screening carotid and renal arteries for evidence of fibromuscular dysplasia.  Troponins peaked at 5591.  Patient comes in for f/u. Very fatigued on the metoprolol. Wants to resume exercise-walking and hiking. No cardiac symptoms. Lots of questions answered.    Past Medical History:  Diagnosis Date  . Benign neoplasm of thyroid gland   . Breast cyst 2014  . Osteopenia   . Thyroid disease    hypothyroidism  . Vitamin D deficiency 03/25/2018    Past Surgical History:  Procedure Laterality Date  . BREAST SURGERY     left aspiration   . CESAREAN SECTION  07/20/2016  . LEFT HEART CATH AND CORONARY ANGIOGRAPHY N/A 02/09/2020   Procedure: LEFT HEART CATH AND CORONARY ANGIOGRAPHY;  Surgeon: Belva Crome, MD;  Location: [facility];  Service: Cardiovascular;  Laterality: N/A;    Current Medications: Current Meds  Medication Sig  . aspirin 81 MG chewable tablet Chew 1 tablet (81 mg total) by mouth daily.  Marland Kitchen atorvastatin (LIPITOR) 10 MG tablet Take 1 tablet (10 mg total) by mouth daily.  . clopidogrel (PLAVIX) 75 MG tablet Take 1 tablet (75 mg total) by mouth daily with breakfast.  . levothyroxine (SYNTHROID, LEVOTHROID)  100 MCG tablet Take 100 mcg by mouth daily before breakfast.   . Multiple Vitamin (MULTIVITAMIN ADULT PO) Take 1 tablet by mouth daily.  . nitroGLYCERIN (NITROSTAT) 0.4 MG SL tablet Place 1 tablet (0.4 mg total) under the tongue every 5 (five) minutes x 3 doses as needed for chest pain.  . Vitamin D, Ergocalciferol, (DRISDOL) 50000 units CAPS capsule Take 50,000 Units by mouth once a week. Wednesdays  . [DISCONTINUED] metoprolol tartrate (LOPRESSOR) 25 MG tablet Take 0.5 tablets (12.5 mg total) by mouth 2 (two) times daily.     Allergies:   Fosamax [alendronate], Latex, and Reclast [zoledronic acid]   Social History   Socioeconomic History  . Marital status: Married    Spouse name: Saachi Zale  . Number of children: 1  . Years of education: 18  . Highest education level: Associate degree: academic program  Occupational History    Comment: prn for health unit coordinator   Tobacco Use  . Smoking status: Never Smoker  . Smokeless tobacco: Never Used  Vaping Use  . Vaping Use: Never used  Substance and Sexual Activity  . Alcohol use: No  . Drug use: No  . Sexual activity: Yes    Partners: Male    Birth control/protection: None  Other Topics Concern  . Not on file  Social History Narrative   Married since 2015, they have one child born 07/20/2016   Works part time at [employer]:   .  Difficulty of Paying Living Expenses:   Food Insecurity:   . Worried About Charity fundraiser in the Last Year:   . Arboriculturist in the Last Year:   Transportation Needs:   . Film/video editor (Medical):   Marland Kitchen Lack of Transportation (Non-Medical):   Physical Activity: Sufficiently Active  . Days of Exercise per Week: 3 days  . Minutes of Exercise per Session: 60 min  Stress:   . Feeling of Stress :   Social Connections:   . Frequency of Communication with Friends and Family:   . Frequency of Social Gatherings with Friends and  Family:   . Attends Religious Services:   . Active Member of Clubs or Organizations:   . Attends Archivist Meetings:   Marland Kitchen Marital Status:      Family History:  The patient's family history includes Alzheimer's disease in her paternal grandmother; CVA (age of onset: 14) in her father; Cancer (age of onset: 57) in her maternal grandmother; Cancer - Colon (age of onset: 82) in her mother; Diabetes Mellitus II in her father; Hyperlipidemia in her mother; Hypertension in her mother.   ROS:   Please see the history of present illness.    ROS All other systems reviewed and are negative.   PHYSICAL EXAM:   VS:  BP 110/72   Pulse 68   Ht 5' 9.5" (1.765 m)   Wt 165 lb 3.2 oz (74.9 kg)   LMP 02/01/2020   SpO2 96%   BMI 24.05 kg/m   Physical Exam  GEN: Well nourished, well developed, in no acute distress  Neck: no JVD, carotid bruits, or masses Cardiac:RRR; no murmurs, rubs, or gallops  Respiratory:  clear to auscultation bilaterally, normal work of breathing GI: soft, nontender, nondistended, + BS Ext: right arm at cath site without hematoma or hemorrhage, lower extremities without cyanosis, clubbing, or edema, Good distal pulses bilaterally Neuro:  Alert and Oriented x 3 Psych: euthymic mood, full affect  Wt Readings from Last 3 Encounters:  02/24/20 165 lb 3.2 oz (74.9 kg)  02/12/20 161 lb 11.2 oz (73.3 kg)  10/30/19 166 lb (75.3 kg)      Studies/Labs Reviewed:   EKG:  EKG is not ordered today.  Recent Labs: 05/22/2019: ALT 11 02/09/2020: TSH 0.466 02/10/2020: BUN 5; Creatinine, Ser 0.60; Hemoglobin 12.1; Platelets 260; Potassium 3.9; Sodium 137   Lipid Panel    Component Value Date/Time   CHOL 155 02/10/2020 0659   TRIG 42 02/10/2020 0659   HDL 51 02/10/2020 0659   CHOLHDL 3.0 02/10/2020 0659   VLDL 8 02/10/2020 0659   LDLCALC 96 02/10/2020 0659   LDLCALC 96 06/03/2018 1451    Additional studies/ records that were reviewed today include:    Echocardiogram  02/09/20:   1. Left ventricular ejection fraction, by estimation, is 55 to 60%. The  left ventricle has normal function. The left ventricle has no regional  wall motion abnormalities. Left ventricular diastolic parameters were  normal.   2. Right ventricular systolic function is normal. The right ventricular  size is normal.   3. The mitral valve is normal in structure. Trivial mitral valve  regurgitation. No evidence of mitral stenosis.   4. The aortic valve is tricuspid. Aortic valve regurgitation is not  visualized. No aortic stenosis is present.   5. The inferior vena cava is normal in size with greater than 50%  respiratory variability, suggesting right atrial pressure of 3 mmHg.  LHC  02/09/20:    Type II spontaneous coronary artery dissection of the large first obtuse marginal with segmental 50% irregular narrowing but maintained TIMI grade III flow.  Patient is asymptomatic at the time of cath.  Normal left main  Normal LAD  Otherwise normal circumflex  Dominant normal right coronary artery  Normal LV systolic function and end-diastolic pressure.   RECOMMENDATIONS:    Beta-blocker therapy and blood pressure control to less than 120/80 mmHg.  Renin angiotensin blockade or calcium channel blocker therapy would be considerations in addition to beta-blocker if needed.  Aspirin and Plavix for least 6 months.  Statin therapy  Avoid full anticoagulation.  Consider screening carotid and renal arteries for evidence of fibromuscular dysplasia  We will check hs-CRP and sed rate.      ASSESSMENT:    1. Coronary artery disease involving native coronary artery of native heart without angina pectoris   2. Hyperlipidemia, unspecified hyperlipidemia type   3. Fatigue, unspecified type      PLAN:  In order of problems listed above:  CAD status post STEMI involving large OM with good TIMI-3 flow, peak troponin 5591.  Plan for aspirin and Plavix for at least 6 months,  low-dose beta-blocker because of at risk myocardial rupture with circumflex infarct/OM and normal hyperdynamic EF.  Recommend screening carotid and renal arteries for evidence of fibromuscular dysplasia-will order. CRP and SED were normal. No chest pain. Can resume walking for exercise.   HLD LDL 96 on lipitor. Recheck FLP in 3 months  Fatigue on metoprolol. She'd like to try long acting toprol. Will start toprol xl 25 mg once daily.  Medication Adjustments/Labs and Tests Ordered: Current medicines are reviewed at length with the patient today.  Concerns regarding medicines are outlined above.  Medication changes, Labs and Tests ordered today are listed in the Patient Instructions below. Patient Instructions  Medication Instructions:  Your physician has recommended you make the following change in your medication:   START taking Metoprolol Succinate 25mg  daily STOP taking Metoprolol Tartrate   *If you need a refill on your cardiac medications before your next appointment, please call your pharmacy*   Lab Work: Your physician recommends that you return for a FASTING lipid profile in 3 months. This has been scheduled on 05/25/2020.   If you have labs (blood work) drawn today and your tests are completely normal, you will receive your results only by: Marland Kitchen MyChart Message (if you have MyChart) OR . A paper copy in the mail If you have any lab test that is abnormal or we need to change your treatment, we will call you to review the results.   Testing/Procedures: Your physician has requested that you have a carotid duplex. This test is an ultrasound of the carotid arteries in your neck. It looks at blood flow through these arteries that supply the brain with blood. Allow one hour for this exam. There are no restrictions or special instructions.  Your physician has requested that you have a renal artery duplex. During this test, an ultrasound is used to evaluate blood flow to the kidneys. Allow  one hour for this exam. Do not eat after midnight the day before and avoid carbonated beverages. Take your medications as you usually do.    Follow-Up: At [facility], you and your health needs are our priority.  As part of our continuing mission to provide you with exceptional heart care, we have created designated Provider Care Teams.  These Care Teams include your  primary Cardiologist (physician) and Advanced Practice Providers (APPs -  Physician Assistants and Nurse Practitioners) who all work together to provide you with the care you need, when you need it.  We recommend signing up for the patient portal called "MyChart".  Sign up information is provided on this After Visit Summary.  MyChart is used to connect with patients for Virtual Visits (Telemedicine).  Patients are able to view lab/test results, encounter notes, upcoming appointments, etc.  Non-urgent messages can be sent to your provider as well.   To learn more about what you can do with MyChart, go to [email or web address].    Your next appointment:   04/30/2020  The format for your next appointment:   In Person  Provider:   Jenkins Rouge, MD   Other Instructions None     Signed, Ermalinda Barrios, PA-C  02/24/2020 8:45 AM    [facility] [address], [place], [place]  [address] Phone: [phone]; Fax: [phone]

## 2020-02-24 ENCOUNTER — Encounter: Payer: Self-pay | Admitting: Physician Assistant

## 2020-02-24 ENCOUNTER — Ambulatory Visit: Payer: 59 | Admitting: Physician Assistant

## 2020-02-24 ENCOUNTER — Other Ambulatory Visit: Payer: Self-pay

## 2020-02-24 VITALS — BP 110/72 | HR 68 | Ht 69.5 in | Wt 165.2 lb

## 2020-02-24 DIAGNOSIS — R5383 Other fatigue: Secondary | ICD-10-CM

## 2020-02-24 DIAGNOSIS — E785 Hyperlipidemia, unspecified: Secondary | ICD-10-CM

## 2020-02-24 DIAGNOSIS — I2510 Atherosclerotic heart disease of native coronary artery without angina pectoris: Secondary | ICD-10-CM | POA: Diagnosis not present

## 2020-02-24 MED ORDER — METOPROLOL SUCCINATE ER 25 MG PO TB24
25.0000 mg | ORAL_TABLET | Freq: Every day | ORAL | 3 refills | Status: DC
Start: 2020-02-24 — End: 2020-06-15

## 2020-02-24 NOTE — Patient Instructions (Addendum)
Medication Instructions:  Your physician has recommended you make the following change in your medication:   START taking Metoprolol Succinate 25mg  daily STOP taking Metoprolol Tartrate   *If you need a refill on your cardiac medications before your next appointment, please call your pharmacy*   Lab Work: Your physician recommends that you return for a FASTING lipid profile in 3 months. This has been scheduled on 05/25/2020.   If you have labs (blood work) drawn today and your tests are completely normal, you will receive your results only by: Marland Kitchen MyChart Message (if you have MyChart) OR . A paper copy in the mail If you have any lab test that is abnormal or we need to change your treatment, we will call you to review the results.   Testing/Procedures: Your physician has requested that you have a carotid duplex. This test is an ultrasound of the carotid arteries in your neck. It looks at blood flow through these arteries that supply the brain with blood. Allow one hour for this exam. There are no restrictions or special instructions.  Your physician has requested that you have a renal artery duplex. During this test, an ultrasound is used to evaluate blood flow to the kidneys. Allow one hour for this exam. Do not eat after midnight the day before and avoid carbonated beverages. Take your medications as you usually do.    Follow-Up: At [facility], you and your health needs are our priority.  As part of our continuing mission to provide you with exceptional heart care, we have created designated Provider Care Teams.  These Care Teams include your primary Cardiologist (physician) and Advanced Practice Providers (APPs -  Physician Assistants and Nurse Practitioners) who all work together to provide you with the care you need, when you need it.  We recommend signing up for the patient portal called "MyChart".  Sign up information is provided on this After Visit Summary.  MyChart is used to  connect with patients for Virtual Visits (Telemedicine).  Patients are able to view lab/test results, encounter notes, upcoming appointments, etc.  Non-urgent messages can be sent to your provider as well.   To learn more about what you can do with MyChart, go to [email or web address].    Your next appointment:   04/30/2020  The format for your next appointment:   In Person  Provider:   Jenkins Rouge, MD   Other Instructions None

## 2020-02-24 NOTE — Addendum Note (Signed)
Addended by: Carylon Perches on: 02/24/2020 08:54 AM   Modules accepted: Orders

## 2020-03-08 ENCOUNTER — Other Ambulatory Visit: Payer: Self-pay

## 2020-03-08 MED ORDER — ATORVASTATIN CALCIUM 10 MG PO TABS: 10.0000 mg | ORAL_TABLET | Freq: Every day | ORAL | 3 refills | Status: DC

## 2020-03-08 MED ORDER — CLOPIDOGREL BISULFATE 75 MG PO TABS: 75.0000 mg | ORAL_TABLET | Freq: Every day | ORAL | 3 refills | Status: DC

## 2020-03-09 ENCOUNTER — Telehealth: Payer: Self-pay | Admitting: Physician Assistant

## 2020-03-09 NOTE — Telephone Encounter (Signed)
Stefanie [name] is returning April's call. She states she would like the letter to be sent in the mail not through my chart. Please advise.

## 2020-03-12 ENCOUNTER — Telehealth: Payer: Self-pay

## 2020-03-12 NOTE — Telephone Encounter (Signed)
Attempted to contact to notify of payment .  lmov

## 2020-03-12 NOTE — Telephone Encounter (Signed)
After speaking with the provider and Missy, I changed this pt's carotid to a Vascuscreen. She did not have an indication other than "screening". She has a renal artery and Vascuscreen sch on the 24th. The renal is covered (other than any co-pay). the Vascuscreen will need a $50 payment at check-in. Estella Husk is on board and still wants both done.

## 2020-03-19 ENCOUNTER — Other Ambulatory Visit: Payer: Self-pay | Admitting: Physician Assistant

## 2020-03-19 DIAGNOSIS — I2542 Coronary artery dissection: Secondary | ICD-10-CM

## 2020-03-22 ENCOUNTER — Emergency Department (HOSPITAL_COMMUNITY): Payer: 59

## 2020-03-22 ENCOUNTER — Emergency Department (HOSPITAL_COMMUNITY)
Admission: EM | Admit: 2020-03-22 | Discharge: 2020-03-23 | Disposition: A | Discharge status: Home / Self Care | Payer: 59 | Attending: Emergency Medicine | Admitting: Emergency Medicine

## 2020-03-22 ENCOUNTER — Telehealth: Payer: Self-pay | Admitting: Cardiovascular Disease

## 2020-03-22 ENCOUNTER — Other Ambulatory Visit: Payer: Self-pay

## 2020-03-22 DIAGNOSIS — R0789 Other chest pain: Secondary | ICD-10-CM | POA: Diagnosis present

## 2020-03-22 DIAGNOSIS — Z5321 Procedure and treatment not carried out due to patient leaving prior to being seen by health care provider: Secondary | ICD-10-CM | POA: Insufficient documentation

## 2020-03-22 LAB — CBC
HCT: 37 % (ref 36.0–46.0)
Hemoglobin: 11.5 g/dL — ABNORMAL LOW (ref 12.0–15.0)
MCH: 28.4 pg (ref 26.0–34.0)
MCHC: 31.1 g/dL (ref 30.0–36.0)
MCV: 91.4 fL (ref 80.0–100.0)
Platelets: 244 10*3/uL (ref 150–400)
RBC: 4.05 MIL/uL (ref 3.87–5.11)
RDW: 13.2 % (ref 11.5–15.5)
WBC: 5 10*3/uL (ref 4.0–10.5)
nRBC: 0 % (ref 0.0–0.2)

## 2020-03-22 LAB — BASIC METABOLIC PANEL
Anion gap: 8 (ref 5–15)
BUN: 9 mg/dL (ref 6–20)
CO2: 26 mmol/L (ref 22–32)
Calcium: 9.3 mg/dL (ref 8.9–10.3)
Chloride: 104 mmol/L (ref 98–111)
Creatinine, Ser: 0.63 mg/dL (ref 0.44–1.00)
GFR calc Af Amer: >60 mL/min (ref >60)
GFR calc non Af Amer: >60 mL/min (ref >60)
Glucose, Bld: 102 mg/dL — ABNORMAL HIGH (ref 70–99)
Potassium: 4.1 mmol/L (ref 3.5–5.1)
Sodium: 138 mmol/L (ref 135–145)

## 2020-03-22 LAB — TROPONIN I (HIGH SENSITIVITY)
Troponin I (High Sensitivity): 6 ng/L (ref <18)
Troponin I (High Sensitivity): 5 ng/L (ref <18)

## 2020-03-22 LAB — I-STAT BETA HCG BLOOD, ED (MC, WL, AP ONLY): I-stat hCG, quantitative: <5 m[IU]/mL (ref <5)

## 2020-03-22 NOTE — Telephone Encounter (Signed)
Pt is calling in with active chest pain since 4 am this morning.  Pt states she was awaken from her sleep, with sharp stabbing chest pain, at 4 am this morning.  Pt states her chest pain has been continuous since 4 am.  Pt states her chest pain is mid-sternal and radiates to the right side of her chest.  She states nothing alleviates the pain, and every time she takes a breath in, the chest pain worsens.  She rates her chest pain a 5 out of 10.  Pt states she is sob while resting and with exertion. She reports her chest pain worsens with any kind of exertion, and/or with any little activity. Pt states her BP last night was 118/69, and currently it is 110/65.  Pt states she is lightheaded since her chest pain occurred this morning.  Pt denies any N/V, diaphoresis, pre-syncopal or syncopal episodes.  Pt states she had NSTEMI back on 02/09/20, as well as had a spontaneous dissection of her coronary artery.  Pt states her chest pain this time feels differently than it did back in July, when she had her NSTEMI. Pt confirms she is taking all her cardiac meds as prescribed.  Pt states if we have no availability with Dr. Johnsie Cancel or an APP within the next 45 mins to an hour, then she feels symptomatic enough to proceed to the ER for further evaluation.  Pt did mention she has not taken any nitroglycerin today.  Informed the pt that we have no availability at requested time frame, and given her cardiac hx, and currently having active chest pain and sob, with no relief since 4 am, the safest option is for her to have someone drive her to the ER for further work-up, or call 911 for transportation.  Pt states her sister just arrived at her house, and will be taking her to the ER at [facility] right now.  Advised her to go ahead and take one nitroglycerin while she is en route to the ER.  Did inform the pt that I will route this message to Dr. Johnsie Cancel and his primary covering RN, for their review upon return to the  office. Pt verbalized understanding and agrees with this plan.  She agrees this is the safest option for her at this time.  Pt appreciative for all the assistance provided.

## 2020-03-22 NOTE — ED Triage Notes (Signed)
Pt arrives via POV from home with right sided chest pain that is sharp in nature. States the pain woke her this morning out of a sleep at 4am. Pain is intermittent, worse with inspiration. 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

## 2020-03-22 NOTE — Telephone Encounter (Signed)
Pt c/o of Chest Pain: STAT if CP now or developed within 24 hours  1. Are you having CP right now? yes  2. Are you experiencing any other symptoms (ex. SOB, nausea, vomiting, sweating)? A little SOB  3. How long have you been experiencing CP? Woke up around 4am with a sharp stabbing pain in her chest  4. Is your CP continuous or coming and going? Coming and going - can feel when she takes a deep breath  5. Have you taken Nitroglycerin? No - took her regular meds with aspirin this morning.  ?

## 2020-03-22 NOTE — ED Notes (Signed)
Pt told the risk of leaving. Pt was encouraged stay. Pt said she did no longer want to wait

## 2020-03-23 ENCOUNTER — Ambulatory Visit (INDEPENDENT_AMBULATORY_CARE_PROVIDER_SITE_OTHER): Payer: 59

## 2020-03-23 ENCOUNTER — Ambulatory Visit: Payer: 59 | Admitting: Nurse Practitioner

## 2020-03-23 ENCOUNTER — Other Ambulatory Visit: Payer: Self-pay

## 2020-03-23 ENCOUNTER — Ambulatory Visit: Payer: 59

## 2020-03-23 ENCOUNTER — Encounter: Payer: Self-pay | Admitting: Nurse Practitioner

## 2020-03-23 VITALS — BP 110/72 | HR 70 | Ht 69.5 in | Wt 166.4 lb

## 2020-03-23 DIAGNOSIS — E785 Hyperlipidemia, unspecified: Secondary | ICD-10-CM

## 2020-03-23 DIAGNOSIS — R079 Chest pain, unspecified: Secondary | ICD-10-CM

## 2020-03-23 DIAGNOSIS — I2510 Atherosclerotic heart disease of native coronary artery without angina pectoris: Secondary | ICD-10-CM | POA: Diagnosis not present

## 2020-03-23 DIAGNOSIS — I10 Essential (primary) hypertension: Secondary | ICD-10-CM | POA: Diagnosis not present

## 2020-03-23 DIAGNOSIS — I2542 Coronary artery dissection: Secondary | ICD-10-CM | POA: Diagnosis not present

## 2020-03-23 DIAGNOSIS — R5383 Other fatigue: Secondary | ICD-10-CM | POA: Diagnosis not present

## 2020-03-23 NOTE — Progress Notes (Signed)
CARDIOLOGY OFFICE NOTE  Date:  03/23/2020    Stefanie [name] Date of Birth: Apr 15, 1976 Medical Record #[id number]  PCP:  Steele Sizer, MD  Cardiologist:  Johnsie Cancel  Chief Complaint  Patient presents with  . Chest Pain    Work in visit - seen for Dr. Johnsie Cancel    History of Present Illness: Stefanie [name] is a 44 y.o. female who presents today for a work in visit. Seen for Dr. Johnsie Cancel.   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  Otherwise had normal left main, LAD, LCX and RCA along with   normal LV function.  It was recommended to be on beta-blocker therapy and blood pressure control to less than 120/80.  Plavix and aspirin added for at least 6 months along with statin therapy. She was to consider vascular screening as well for fibromuscular dysplasia. Sed rate and CRP were normal.   She was seen for her post hospital visit just about a month ago by Ermalinda Barrios, PA. Very fatigued on metoprolol and this was changed to Toprol.   Back to the ER last night with chest pain but left AMA.  HS troponin was negative x 2.   Comes in today. Here alone. Had her vascular screening studies earlier today over at [facility]. 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 No evidence of FMD noted.   She notes she is feeling better today. Had woken up yesterday at around 4 AM with a sharp intermittent pain - very fleeting - nothing like her chest pain associated with her MI. She got very scared. Otherwise, she has done fine. Has been walking. No problems. Not dizzy.  BP is fine at home. She feels much better with the Toprol - her fatigue improved. Has noted some bruising with the DAPT. No excessive bleeding.   Past Medical History:  Diagnosis Date  . Benign neoplasm of  thyroid gland   . Breast cyst 2014  . Osteopenia   . Thyroid disease    hypothyroidism  . Vitamin D deficiency 03/25/2018    Past Surgical History:  Procedure Laterality Date  . BREAST SURGERY     left aspiration   . CESAREAN SECTION  07/20/2016  . LEFT HEART CATH AND CORONARY ANGIOGRAPHY N/A 02/09/2020   Procedure: LEFT HEART CATH AND CORONARY ANGIOGRAPHY;  Surgeon: Belva Crome, MD;  Location: [place] [facility];  Service: Cardiovascular;  Laterality: N/A;     Medications: Current Meds  Medication Sig  . aspirin 81 MG chewable tablet Chew 1 tablet (81 mg total) by mouth daily.  Marland Kitchen atorvastatin (LIPITOR) 10 MG tablet Take 1 tablet (10 mg total) by mouth daily.  . clopidogrel (PLAVIX) 75 MG tablet Take 1 tablet (75 mg total) by mouth daily with breakfast.  . levothyroxine (SYNTHROID, LEVOTHROID) 100 MCG tablet Take 100 mcg by mouth daily before breakfast.   . metoprolol succinate (TOPROL XL) 25 MG 24 hr tablet Take 1 tablet (25 mg total) by mouth daily.  . Multiple Vitamin (MULTIVITAMIN ADULT PO) Take 1 tablet by mouth daily.  . nitroGLYCERIN (NITROSTAT) 0.4 MG SL tablet Place 1 tablet (0.4 mg total) under the tongue every 5 (five) minutes x 3 doses as needed for chest pain.  . Vitamin D, Ergocalciferol, (DRISDOL) 50000  units CAPS capsule Take 50,000 Units by mouth once a week. Wednesdays     Allergies: Allergies  Allergen Reactions  . Fosamax [Alendronate] Other (See Comments)    stomachache  . Latex Itching  . Reclast [Zoledronic Acid] Rash    Social History: The patient  reports that she has never smoked. She has never used smokeless tobacco. She reports that she does not drink alcohol and does not use drugs.   Family History: The patient's family history includes Alzheimer's disease in her paternal grandmother; CVA (age of onset: 17) in her father; Cancer (age of onset: 24) in her maternal grandmother; Cancer - Colon (age of onset: 86) in her mother; Diabetes Mellitus  II in her father; Hyperlipidemia in her mother; Hypertension in her mother.   Review of Systems: Please see the history of present illness.   All other systems are reviewed and negative.   Physical Exam: VS:  BP 110/72   Pulse 70   Ht 5' 9.5" (1.765 m)   Wt 166 lb 6.4 oz (75.5 kg)   SpO2 97%   BMI 24.22 kg/m  .  BMI Body mass index is 24.22 kg/m.  Wt Readings from Last 3 Encounters:  03/23/20 166 lb 6.4 oz (75.5 kg)  02/24/20 165 lb 3.2 oz (74.9 kg)  02/12/20 161 lb 11.2 oz (73.3 kg)    General: Pleasant. Alert and in no acute distress.   Cardiac: Regular rate and rhythm. No murmurs, rubs, or gallops. No edema.  Respiratory:  Lungs are clear to auscultation bilaterally with normal work of breathing.  GI: Soft and nontender.  MS: No deformity or atrophy. Gait and ROM intact.  Skin: Warm and dry. Color is normal.  Neuro:  Strength and sensation are intact and no gross focal deficits noted.  Psych: Alert, appropriate and with normal affect.   LABORATORY DATA:  EKG:  EKG is ordered today.  Personally reviewed by me. This demonstrates NSR with no acute changes.  Lab Results  Component Value Date   WBC 5.0 03/22/2020   HGB 11.5 (L) 03/22/2020   HCT 37.0 03/22/2020   PLT 244 03/22/2020   GLUCOSE 102 (H) 03/22/2020   CHOL 155 02/10/2020   TRIG 42 02/10/2020   HDL 51 02/10/2020   LDLCALC 96 02/10/2020   ALT 11 05/22/2019   AST 12 (A) 05/22/2019   NA 138 03/22/2020   K 4.1 03/22/2020   CL 104 03/22/2020   CREATININE 0.63 03/22/2020   BUN 9 03/22/2020   CO2 26 03/22/2020   TSH 0.466 02/09/2020   HGBA1C 5.6 02/09/2020     BNP (last 3 results) No results for input(s): BNP in the last 8760 hours.  ProBNP (last 3 results) No results for input(s): PROBNP in the last 8760 hours.   Other Studies Reviewed Today:  Vascuscreen 03/23/20  Summary:  Right Carotid: Normal: little or no evidence of plaque visualized in the  right internal carotid artery.  Left Carotid:  Normal: little or no evidence of plaque visualized in the  left internal carotid artery.    Renal:    Right: Normal size right kidney. Normal right Resisitive Index.     Normal cortical thickness of right kidney. No evidence of     right renal artery stenosis. RRV flow present.  Left: No evidence of left renal artery stenosis. LRV flow present.     Normal size of left kidney. Normal left Resistive Index.     Normal cortical thickness of the left kidney.  1-59% stenosis     of the left renal artery.  Mesenteric:  Normal Celiac artery and Superior Mesenteric artery findings.    No obvious FMD by color Doppler.     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  Patient is asymptomatic at the time of cath.  Normal left main  Normal LAD  Otherwise normal circumflex  Dominant normal right coronary artery  Normal LV systolic function and end-diastolic pressure.  RECOMMENDATIONS:   Beta-blocker therapy and blood pressure control to less than 120/80 mmHg.  Renin angiotensin blockade or calcium channel blocker therapy would be considerations in addition to beta-blocker if needed.  Aspirin and Plavix for least 6 months.  Statin therapy  Avoid full anticoagulation.  Consider screening carotid and renal arteries for evidence of fibromuscular dysplasia  We will check hs-CRP and sed rate.    ECHO IMPRESSIONS 01/2020  1. Left ventricular ejection fraction, by estimation, is 55 to 60%. The  left ventricle has normal function. The left ventricle has no regional  wall motion abnormalities. Left ventricular diastolic parameters were  normal.  2. Right ventricular systolic function is normal. The right ventricular  size is normal.  3. The mitral valve is normal in structure.  Trivial mitral valve  regurgitation. No evidence of mitral stenosis.  4. The aortic valve is tricuspid. Aortic valve regurgitation is not  visualized. No aortic stenosis is present.  5. The inferior vena cava is normal in size with greater than 50%  respiratory variability, suggesting right atrial pressure of 3 mmHg.      ASSESSMENT & PLAN:    1. Chest pain - atypical - her troponins were negative. Symptoms resolved. She is reassured. She has had no exertional issues.   2. Recent STEMI with coronary dissection - managed medically. See #1.   3. HLD - on statin - will need follow up fasting labs on return.   4. Fatigue - this has improved with the change to long acting Toprol - we will continue.   5. Mild RAS - normal creatinine - no FMD noted - would follow and manage risk factors for CV disease.   Current medicines are reviewed with the patient today.  The patient does not have concerns regarding medicines other than what has been noted above.  The following changes have been made:  See above.  Labs/ tests ordered today include:    Orders Placed This Encounter  Procedures  . EKG 12-Lead     Disposition:   FU with Dr. Johnsie Cancel as planned.    Patient is agreeable to this plan and will call if any problems develop in the interim.   SignedTruitt Merle, NP  03/23/2020 3:34 PM  [place] [address] [place], [place]  [address] Phone: [phone] Fax: [phone]

## 2020-03-23 NOTE — Telephone Encounter (Signed)
Called patient back. Patient went to the ED but left before she saw anyone. Patient stated they gave her a nitroglycerin and her chest pain went away an hour later. Patient stated at this time she does not have chest pain or short of breath, but would like to have an appointment to be evaluated. Made patient an appointment with Truitt Merle NP today. Patient agreed to plan.

## 2020-03-23 NOTE — Patient Instructions (Addendum)
After Visit Summary:  We will be checking the following labs today - NONE   Medication Instructions:    Continue with your current medicines.    If you need a refill on your cardiac medications before your next appointment, please call your pharmacy.     Testing/Procedures To Be Arranged:  N/A  Follow-Up:   See Dr. Johnsie Cancel as planned in October.     At [facility], you and your health needs are our priority.  As part of our continuing mission to provide you with exceptional heart care, we have created designated Provider Care Teams.  These Care Teams include your primary Cardiologist (physician) and Advanced Practice Providers (APPs -  Physician Assistants and Nurse Practitioners) who all work together to provide you with the care you need, when you need it.  Special Instructions:  . Stay safe, wash your hands for at least 20 seconds and wear a mask when needed.  . It was good to talk with you today.    Call the [place] office at [phone] if you have any questions, problems or concerns.

## 2020-04-16 NOTE — Progress Notes (Signed)
CARDIOLOGY OFFICE NOTE  Date:  04/30/2020    Stefanie [name] Date of Birth: 08/21/1975 Medical Record #[id number]  PCP:  Steele Sizer, MD  Cardiologist:  Johnsie Cancel  No chief complaint on file.   History of Present Illness: Stefanie [name] is a 44 y.o. 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  Otherwise had normal left main, LAD, LCX and RCA along with   normal LV function.  It was recommended to be on beta-blocker therapy and blood pressure control to less than 120/80.  Plavix and aspirin added for at least 6 months along with statin therapy. She was to consider vascular screening as well for fibromuscular dysplasia. Sed rate and CRP were normal.   She was seen by Ermalinda Barrios, PA 02/24/20 . Very fatigued on metoprolol and this was changed to Toprol.   Back to the ER 03/22/20 with chest pain but left AMA.  HS troponin was negative x 2.   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 Benign neoplasm of thyroid gland   . Breast cyst 2014  . Osteopenia   . Thyroid disease    hypothyroidism  . Vitamin D deficiency 03/25/2018    Past Surgical History:  Procedure Laterality Date  . BREAST SURGERY     left aspiration   . CESAREAN SECTION  07/20/2016  . LEFT HEART CATH AND CORONARY ANGIOGRAPHY N/A 02/09/2020   Procedure: LEFT HEART CATH AND CORONARY ANGIOGRAPHY;  Surgeon: Belva Crome, MD;  Location: [place] [facility];  Service: Cardiovascular;  Laterality: N/A;     Medications: Current Meds  Medication  Sig  . aspirin 81 MG chewable tablet Chew 1 tablet (81 mg total) by mouth daily.  Marland Kitchen atorvastatin (LIPITOR) 10 MG tablet Take 1 tablet (10 mg total) by mouth daily.  . clopidogrel (PLAVIX) 75 MG tablet Take 1 tablet (75 mg total) by mouth daily with breakfast.  . levothyroxine (SYNTHROID, LEVOTHROID) 100 MCG tablet Take 100 mcg by mouth daily before breakfast.   . metoprolol succinate (TOPROL XL) 25 MG 24 hr tablet Take 1 tablet (25 mg total) by mouth daily.  . Multiple Vitamin (MULTIVITAMIN ADULT PO) Take 1 tablet by mouth daily.  . nitroGLYCERIN (NITROSTAT) 0.4 MG SL tablet Place 1 tablet (0.4 mg total) under the tongue every 5 (five) minutes x 3 doses as needed for chest pain.  . Vitamin D, Ergocalciferol, (DRISDOL) 50000 units CAPS capsule Take 50,000 Units by mouth once a week. Wednesdays     Allergies: Allergies  Allergen Reactions  . Fosamax [Alendronate] Other (See Comments)    stomachache  . Latex Itching  . Reclast [Zoledronic Acid] Rash    Social History: The patient  reports that she has never smoked. She has never used smokeless tobacco. She reports that she does not drink alcohol and does not use drugs.   Family History: The patient's family history includes Alzheimer's disease in her paternal grandmother; CVA (age of onset: 10) in her father; Cancer (age of  onset: 91) in her maternal grandmother; Cancer - Colon (age of onset: 7) in her mother; Diabetes Mellitus II in her father; Hyperlipidemia in her mother; Hypertension in her mother.   Review of Systems: Please see the history of present illness.   All other systems are reviewed and negative.   Physical Exam: VS:  BP 106/60   Pulse 70   Ht 5' 9.5" (1.765 m)   Wt 169 lb 6.4 oz (76.8 kg)   SpO2 99%   BMI 24.66 kg/m  .  BMI Body mass index is 24.66 kg/m.  Wt Readings from Last 3 Encounters:  04/30/20 169 lb 6.4 oz (76.8 kg)  03/23/20 166 lb 6.4 oz (75.5 kg)  02/24/20 165 lb 3.2 oz (74.9 kg)    Affect  appropriate Healthy:  appears stated age HEENT: normal Neck supple with no adenopathy JVP normal no bruits no thyromegaly Lungs clear with no wheezing and good diaphragmatic motion Heart:  S1/S2 no murmur, no rub, gallop or click PMI normal Abdomen: benighn, BS positve, no tenderness, no AAA no bruit.  No HSM or HJR Distal pulses intact with no bruits No edema Neuro non-focal Skin warm and dry No muscular weakness    LABORATORY DATA:  EKG:  03/23/20  NSR normal no acute changes   Lab Results  Component Value Date   WBC 5.0 03/22/2020   HGB 11.5 (L) 03/22/2020   HCT 37.0 03/22/2020   PLT 244 03/22/2020   GLUCOSE 102 (H) 03/22/2020   CHOL 155 02/10/2020   TRIG 42 02/10/2020   HDL 51 02/10/2020   LDLCALC 96 02/10/2020   ALT 11 05/22/2019   AST 12 (A) 05/22/2019   NA 138 03/22/2020   K 4.1 03/22/2020   CL 104 03/22/2020   CREATININE 0.63 03/22/2020   BUN 9 03/22/2020   CO2 26 03/22/2020   TSH 0.466 02/09/2020   HGBA1C 5.6 02/09/2020     BNP (last 3 results) No results for input(s): BNP in the last 8760 hours.  ProBNP (last 3 results) No results for input(s): PROBNP in the last 8760 hours.   Other Studies Reviewed Today:  Vascuscreen 04/30/20  Summary:  Right Carotid: Normal: little or no evidence of plaque visualized in the  right internal carotid artery.  Left Carotid: Normal: little or no evidence of plaque visualized in the  left internal carotid artery.    Renal:    Right: Normal size right kidney. Normal right Resisitive Index.     Normal cortical thickness of right kidney. No evidence of     right renal artery stenosis. RRV flow present.  Left: No evidence of left renal artery stenosis. LRV flow present.     Normal size of left kidney. Normal left Resistive Index.     Normal cortical thickness of the left kidney. 1-59% stenosis     of the left renal artery.  Mesenteric:  Normal Celiac artery and Superior Mesenteric  artery findings.    No obvious FMD by color Doppler.     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  Patient is asymptomatic at the time of cath.  Normal left main  Normal LAD  Otherwise normal circumflex  Dominant normal right coronary artery  Normal LV systolic function and end-diastolic pressure.  RECOMMENDATIONS:   Beta-blocker therapy and blood pressure control to less  than 120/80 mmHg.  Renin angiotensin blockade or calcium channel blocker therapy would be considerations in addition to beta-blocker if needed.  Aspirin and Plavix for least 6 months.  Statin therapy  Avoid full anticoagulation.  Consider screening carotid and renal arteries for evidence of fibromuscular dysplasia  We will check hs-CRP and sed rate.    ECHO IMPRESSIONS 01/2020  1. Left ventricular ejection fraction, by estimation, is 55 to 60%. The  left ventricle has normal function. The left ventricle has no regional  wall motion abnormalities. Left ventricular diastolic parameters were  normal.  2. Right ventricular systolic function is normal. The right ventricular  size is normal.  3. The mitral valve is normal in structure. Trivial mitral valve  regurgitation. No evidence of mitral stenosis.  4. The aortic valve is tricuspid. Aortic valve regurgitation is not  visualized. No aortic stenosis is present.  5. The inferior vena cava is normal in size with greater than 50%  respiratory variability, suggesting right atrial pressure of 3 mmHg.      ASSESSMENT & PLAN:    1. Chest pain - atypical - her troponins were negative. Symptoms resolved. She is reassured. She has had no exertional issues.   2. Recent STEMI with coronary dissection - managed medically. See #1.   3. HLD - on statin - will need  follow up fasting labs on return.   4. Fatigue - this has improved with the change to long acting Toprol - we will continue.   5. Mild RAS - normal creatinine - no FMD noted - would follow and manage risk factors for CV disease.   Current medicines are reviewed with the patient today.  The patient does not have concerns regarding medicines other than what has been noted above.  The following changes have been made:  See above.  Labs/ tests ordered today include:    No orders of the defined types were placed in this encounter.    Disposition:   FU with me 6 months consider stopping plavix February    Patient is agreeable to this plan and will call if any problems develop in the interim.   Signed: Jenkins Rouge, MD  04/30/2020 9:31 AM  [facility] [address] [place], [place]  [address] Phone: [phone] Fax: [phone]

## 2020-04-29 ENCOUNTER — Telehealth: Payer: Self-pay | Admitting: Family Medicine

## 2020-04-30 ENCOUNTER — Other Ambulatory Visit: Payer: Self-pay

## 2020-04-30 ENCOUNTER — Encounter: Payer: Self-pay | Admitting: Cardiovascular Disease

## 2020-04-30 ENCOUNTER — Ambulatory Visit (INDEPENDENT_AMBULATORY_CARE_PROVIDER_SITE_OTHER): Payer: 59 | Admitting: Cardiovascular Disease

## 2020-04-30 VITALS — BP 106/60 | HR 70 | Ht 69.5 in | Wt 169.4 lb

## 2020-04-30 DIAGNOSIS — I2542 Coronary artery dissection: Secondary | ICD-10-CM | POA: Diagnosis not present

## 2020-04-30 NOTE — Patient Instructions (Addendum)
Medication Instructions:  Your physician has recommended you make the following change in your medication:  1-STOP Plavix in February  *If you need a refill on your cardiac medications before your next appointment, please call your pharmacy*  Lab Work: If you have labs (blood work) drawn today and your tests are completely normal, you will receive your results only by: Marland Kitchen MyChart Message (if you have MyChart) OR . A paper copy in the mail If you have any lab test that is abnormal or we need to change your treatment, we will call you to review the results.  Testing/Procedures: None ordered today.  Follow-Up: At [facility], you and your health needs are our priority.  As part of our continuing mission to provide you with exceptional heart care, we have created designated Provider Care Teams.  These Care Teams include your primary Cardiologist (physician) and Advanced Practice Providers (APPs -  Physician Assistants and Nurse Practitioners) who all work together to provide you with the care you need, when you need it.  We recommend signing up for the patient portal called "MyChart".  Sign up information is provided on this After Visit Summary.  MyChart is used to connect with patients for Virtual Visits (Telemedicine).  Patients are able to view lab/test results, encounter notes, upcoming appointments, etc.  Non-urgent messages can be sent to your provider as well.   To learn more about what you can do with MyChart, go to [email or web address].    Your next appointment:   6 month(s)  The format for your next appointment:   In Person  Provider:   You may see Jenkins Rouge, MD or one of the following Advanced Practice Providers on your designated Care Team:    Truitt Merle, NP  Cecilie Kicks, NP  Kathyrn Drown, NP

## 2020-05-18 ENCOUNTER — Ambulatory Visit: Payer: 59 | Admitting: Cardiovascular Disease

## 2020-05-25 ENCOUNTER — Other Ambulatory Visit: Payer: 59

## 2020-05-27 ENCOUNTER — Encounter: Payer: Self-pay | Admitting: Family Medicine

## 2020-06-08 NOTE — Patient Instructions (Signed)

## 2020-06-08 NOTE — Progress Notes (Signed)
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 Echo showed normal EF, she is following up with cardiologist Dr.Nishan, she states she was evaluated for fibromuscular dysplasia. She had another episode of chest pain 03/22/2020 but she left without being seen, troponin levels were normal the second time . She is on aspirin and Plavix also on low dose betablocker and statin therapy.   Mild anemia: she eats mostly chicken and fish but has noticed that she has been craving red meat lately. Denies other weird craves. She is taking a MVI with iron . She has regular cycles and very heavy over the past couple of months, explained likely from aspirin and plavix    Hypothyroidism: under the care of Dr. Ronnald Collum, last TSH was at goal   Diet: balanced  Exercise: she exercises 4-5 x a week for about one hour     Office Visit from 06/03/2018 in [facility]  AUDIT-C Score 0     Depression: Phq 9 is  negative Depression screen [facility] 2/9 06/09/2020 06/09/2020 06/09/2019 06/03/2018 09/24/2017  Decreased Interest 0 0 0 0 0  Down, Depressed, Hopeless 0 0 0 0 0  PHQ - 2 Score 0 0 0 0 0  Altered sleeping 0 - 0 0 -  Tired, decreased energy 0 - 0 0 -  Change in appetite 0 - 0 0 -  Feeling bad or failure about yourself  0 - 0 0 -  Trouble concentrating 0 - 0 0 -  Moving slowly or fidgety/restless 0 - 0 0 -  Suicidal thoughts 0 - 0 0 -  PHQ-9 Score 0 - 0 0 -  Difficult doing work/chores Not difficult at all - Not difficult at all Not difficult at all -   Hypertension: BP Readings from Last 3 Encounters:  06/09/20 126/78  04/30/20 106/60  03/23/20 110/72   Obesity: Wt  Readings from Last 3 Encounters:  06/09/20 165 lb (74.8 kg)  04/30/20 169 lb 6.4 oz (76.8 kg)  03/23/20 166 lb 6.4 oz (75.5 kg)   BMI Readings from Last 3 Encounters:  06/09/20 23.68 kg/m  04/30/20 24.66 kg/m  03/23/20 24.22 kg/m     Vaccines:   Pneumonia: educated and discussed with patient. Flu: Given at today's visit, educated and discussed with patient.  Hep C Screening: Ordered at today's visit STD testing and prevention (HIV/chl/gon/syphilis): today  Intimate partner violence: negative screen  Sexual History :one partner, no problems with pain, or discharge  Menstrual History/LMP/Abnormal Bleeding: cycles are regular but has been heavy lately. LMP was 05/26/2020  Incontinence Symptoms: no problems   Breast cancer:  - Last Mammogram: 11/20/2019 - BRCA gene screening: N/A  Osteoporosis: Discussed high calcium and vitamin D supplementation, weight bearing exercises  Cervical cancer screening: 06/03/2018  Skin cancer: Discussed monitoring for atypical lesions  Colorectal cancer: N/A   ECG: 03/23/2020  Advanced Care Planning: A voluntary discussion about advance care planning including the explanation and discussion of advance directives.  Discussed health care proxy and Living will, and the patient was able to identify a health care proxy as husband  Patient does not have a living will at present time.  Lipids: Lab Results  Component Value Date   CHOL 155 02/10/2020   CHOL 150 05/22/2019   CHOL 171 06/03/2018   Lab Results  Component Value Date   HDL 51 02/10/2020   HDL 50 05/22/2019   HDL 50 (L) 06/03/2018   Lab Results  Component Value Date   LDLCALC 96 02/10/2020   LDLCALC 89 05/22/2019   LDLCALC 96 06/03/2018   Lab Results  Component Value Date   TRIG 42 02/10/2020   TRIG 49 05/22/2019   TRIG 150 (H) 06/03/2018   Lab Results  Component Value Date   CHOLHDL 3.0 02/10/2020   CHOLHDL 3.4 06/03/2018   No results found for:  LDLDIRECT  Glucose: Glucose, Bld  Date Value Ref Range Status  03/22/2020 102 (H) 70 - 99 mg/dL Final    Comment:    Glucose reference range applies only to samples taken after fasting for at least 8 hours.  02/10/2020 96 70 - 99 mg/dL Final    Comment:    Glucose reference range applies only to samples taken after fasting for at least 8 hours.  02/09/2020 109 (H) 70 - 99 mg/dL Final    Comment:    Glucose reference range applies only to samples taken after fasting for at least 8 hours.    Patient Active Problem List   Diagnosis Date Noted  . Humerus lesion, left 02/12/2020  . Unstable angina ([place]) 02/09/2020  . Spontaneous dissection of coronary artery   . NSTEMI (non-ST elevated myocardial infarction) ([place])   . History of cesarean delivery 09/23/2018  . Breast lump in lower-outer quadrant 06/03/2018  . Family history of colon cancer in mother 06/03/2018  . 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 Vitamin D deficiency 03/25/2018  . Hypothyroidism 09/24/2017  . Umbilical hernia 76/54/6503  . History of osteoporosis 05/16/2017  . Thyroid nodule 05/16/2017  . Osteoporosis 05/16/2017  . Uterine leiomyoma 03/07/2016    Past Surgical History:  Procedure Laterality Date  . BREAST SURGERY     left aspiration   . CESAREAN SECTION  07/20/2016  . LEFT HEART CATH AND CORONARY ANGIOGRAPHY N/A 02/09/2020   Procedure: LEFT HEART CATH AND CORONARY ANGIOGRAPHY;  Surgeon: Belva Crome, MD;  Location: [facility];  Service: Cardiovascular;  Laterality: N/A;    Family History  Problem Relation Age of Onset  . Cancer - Colon Mother 24  . Hyperlipidemia Mother   . Hypertension Mother   . Diabetes Mellitus II Father   . CVA Father 14  . Cancer Maternal Grandmother 53  . Alzheimer's disease Paternal Grandmother     Social History   Socioeconomic History  . Marital status: Married    Spouse name: Myrtie Leuthold  . Number of children: 1  . Years of education: 50   . Highest education level: Associate degree: academic program  Occupational History    Comment: prn for health unit coordinator   Tobacco Use  . Smoking status: Never Smoker  . Smokeless tobacco: Never Used  Vaping Use  . Vaping Use: Never used  Substance and Sexual Activity  . Alcohol use: No  . Drug use: No  . Sexual activity: Yes    Partners: Male    Birth control/protection: None  Other Topics Concern  . Not on file  Social History Narrative   Married since 2015, they have one child born 07/20/2016   Works part time at [employer]:  Low Risk   . Difficulty of Paying Living Expenses: Not hard at all  Food Insecurity: No Food Insecurity  . Worried About Charity fundraiser in the Last Year: Never true  . Ran Out of Food in the Last Year: Never true  Transportation Needs: No Transportation Needs  . Lack of Transportation (Medical): No  . Lack of Transportation (Non-Medical): No  Physical Activity: Sufficiently Active  . Days of Exercise per Week: 4 days  . Minutes of Exercise per Session: 60 min  Stress: No Stress Concern Present  . Feeling of Stress : Not at all  Social Connections: Socially Integrated  . Frequency of Communication with Friends and Family: More than three times a week  . Frequency of Social Gatherings with Friends and Family: Twice a week  . Attends Religious Services: More than 4 times per year  . Active Member of Clubs or Organizations: Yes  . Attends Archivist Meetings: More than 4 times per year  . Marital Status: Married  Human resources officer Violence: Not At Risk  . Fear of Current or Ex-Partner: No  . Emotionally Abused: No  . Physically Abused: No  . Sexually Abused: No     Current Outpatient Medications:  .  aspirin 81 MG chewable tablet, Chew 1 tablet (81 mg total) by mouth daily., Disp: , Rfl:  .  atorvastatin (LIPITOR) 10 MG tablet, Take 1 tablet (10 mg total) by mouth  daily., Disp: 90 tablet, Rfl: 3 .  clopidogrel (PLAVIX) 75 MG tablet, Take 1 tablet (75 mg total) by mouth daily with breakfast., Disp: 90 tablet, Rfl: 3 .  levothyroxine (SYNTHROID, LEVOTHROID) 100 MCG tablet, Take 100 mcg by mouth daily before breakfast. , Disp: , Rfl:  .  metoprolol succinate (TOPROL XL) 25 MG 24 hr tablet, Take 1 tablet (25 mg total) by mouth daily., Disp: 30 tablet, Rfl: 3 .  Multiple Vitamin (MULTIVITAMIN ADULT PO), Take 1 tablet by mouth daily., Disp: , Rfl:  .  nitroGLYCERIN (NITROSTAT) 0.4 MG SL tablet, Place 1 tablet (0.4 mg total) under the tongue every 5 (five) minutes x 3 doses as needed for chest pain., Disp: 25 tablet, Rfl: 0 .  Vitamin D, Ergocalciferol, (DRISDOL) 50000 units CAPS capsule, Take 50,000 Units by mouth once a week. Wednesdays, Disp: , Rfl: 1  Allergies  Allergen Reactions  . Fosamax [Alendronate] Other (See Comments)    stomachache  . Latex Itching  . Reclast [Zoledronic Acid] Rash     ROS  Constitutional: Negative for fever or weight change.  Respiratory: Negative for cough and shortness of breath.   Cardiovascular: Negative for chest pain or palpitations.  Gastrointestinal: Negative for abdominal pain, no bowel changes.  Musculoskeletal: Negative for gait problem or joint swelling.  Skin: Negative for rash.  Neurological: Negative for dizziness or headache.  No other specific complaints in a complete review of systems (except as listed in HPI above).  Objective  Vitals:   06/09/20 1338  BP: 126/78  Pulse: 88  Resp: 16  Temp: 98.1 F (36.7 C)  TempSrc: Oral  SpO2: 99%  Weight: 165 lb (74.8 kg)  Height: _0  (1.778 m)    Body mass index is 23.68 kg/m.  Physical Exam  Constitutional: Patient appears well-developed and well-nourished. No distress.  HENT: Head: Normocephalic and atraumatic. Ears: B TMs ok, no erythema or effusion; Nose: not done  Mouth/Throat: not done  Eyes: Conjunctivae and EOM are normal. Pupils are  equal, round, and reactive to  light. No scleral icterus.  Neck: Normal range of motion. Neck supple. No JVD present. No thyromegaly present.  Cardiovascular: Normal rate, regular rhythm and normal heart sounds.  No murmur heard. No BLE edema. Pulmonary/Chest: Effort normal and breath sounds normal. No respiratory distress. Abdominal: Soft. Bowel sounds are normal, no distension. There is no tenderness. no masses Breast: bilateral lumps but a larger lump at 5 o'clock position of left breast, no nipple discharge, we will order diagnostic mammogram she goes to [place]:  Not done  RECTAL: not done  Musculoskeletal: Normal range of motion, no joint effusions. No gross deformities Neurological: he is alert and oriented to person, place, and time. No cranial nerve deficit. Coordination, balance, strength, speech and gait are normal.  Skin: Skin is warm and dry. No rash noted. No erythema.  Psychiatric: Patient has a normal mood and affect. behavior is normal. Judgment and thought content normal.  Recent Results (from the past 2160 hour(s))  Basic metabolic panel     Status: Abnormal   Collection Time: 03/22/20  1:40 PM  Result Value Ref Range   Sodium 138 135 - 145 mmol/L   Potassium 4.1 3.5 - 5.1 mmol/L   Chloride 104 98 - 111 mmol/L   CO2 26 22 - 32 mmol/L   Glucose, Bld 102 (H) 70 - 99 mg/dL    Comment: Glucose reference range applies only to samples taken after fasting for at least 8 hours.   BUN 9 6 - 20 mg/dL   Creatinine, Ser 0.63 0.44 - 1.00 mg/dL   Calcium 9.3 8.9 - 10.3 mg/dL   GFR calc non Af Amer >60 >60 mL/min   GFR calc Af Amer >60 >60 mL/min   Anion gap 8 5 - 15    Comment: Performed at [facility] [address] [place], [place] [address]  CBC     Status: Abnormal   Collection Time: 03/22/20  1:40 PM  Result Value Ref Range   WBC 5.0 4.0 - 10.5 K/uL   RBC 4.05 3.87 - 5.11 MIL/uL   Hemoglobin 11.5 (L) 12.0 - 15.0 g/dL   HCT 37.0 36 - 46 %   MCV  91.4 80.0 - 100.0 fL   MCH 28.4 26.0 - 34.0 pg   MCHC 31.1 30.0 - 36.0 g/dL   RDW 13.2 11.5 - 15.5 %   Platelets 244 150 - 400 K/uL   nRBC 0.0 0.0 - 0.2 %    Comment: Performed at [facility], [place] [address] [place], [place] [address]  Troponin I (High Sensitivity)     Status: None   Collection Time: 03/22/20  1:40 PM  Result Value Ref Range   Troponin I (High Sensitivity) 6 <18 ng/L    Comment: (NOTE) Elevated high sensitivity troponin I (hsTnI) values and significant  changes across serial measurements may suggest ACS but many other  chronic and acute conditions are known to elevate hsTnI results.  Refer to the "Links" section for chest pain algorithms and additional  guidance. Performed at [facility], [place] [address], [place], [place] [address]   I-Stat beta hCG blood, ED     Status: None   Collection Time: 03/22/20  2:09 PM  Result Value Ref Range   I-stat hCG, quantitative <5.0 <5 mIU/mL   Comment 3            Comment:   GEST. AGE      CONC.  (mIU/mL)   <=  1 WEEK        5 - 50     2 WEEKS       50 - 500     3 WEEKS       100 - 10,000     4 WEEKS     1,000 - 30,000        FEMALE AND NON-PREGNANT FEMALE:     LESS THAN 5 mIU/mL   Troponin I (High Sensitivity)     Status: None   Collection Time: 03/22/20  8:38 PM  Result Value Ref Range   Troponin I (High Sensitivity) 5 <18 ng/L    Comment: (NOTE) Elevated high sensitivity troponin I (hsTnI) values and significant  changes across serial measurements may suggest ACS but many other  chronic and acute conditions are known to elevate hsTnI results.  Refer to the "Links" section for chest pain algorithms and additional  guidance. Performed at [facility], [place] [address], [place], [place] [address]       Fall Risk: Fall Risk  06/09/2020 06/09/2019 06/03/2018 10/10/2017 09/24/2017  Falls in the past year? 0 0 0 No No  Number falls in past yr: 0 0 - - -  Injury with Fall? 0 0 - - -     Functional  Status Survey: Is the patient deaf or have difficulty hearing?: No Does the patient have difficulty seeing, even when wearing glasses/contacts?: No Does the patient have difficulty concentrating, remembering, or making decisions?: No Does the patient have difficulty walking or climbing stairs?: No Does the patient have difficulty dressing or bathing?: No Does the patient have difficulty doing errands alone such as visiting a doctor's office or shopping?: No   Assessment & Plan   1. Unstable angina (HCC)  - Lipid panel - COMPLETE METABOLIC PANEL WITH GFR  2. Breast cancer screening by mammogram  - MM 3D SCREEN BREAST BILATERAL; Future  3. History of non-ST elevation myocardial infarction (NSTEMI)   4. Need for hepatitis C screening test  - Hepatitis C Antibody  5. Well adult exam   6. Bilateral breast cysts   7. Hypothyroidism, unspecified type  Continue supplementation   8. Vitamin D deficiency  Continue supplementation   9. Anemia, mild  - CBC with Differential/Platelet - Iron, TIBC and Ferritin Panel  10. Routine screening for STI (sexually transmitted infection)  - RPR - HIV Antibody (routine testing w rflx)  11. Breast mass, left  - MM Digital Diagnostic Bilat; Future  12. Abnormal mammogram of right breast  - MM Digital Diagnostic Bilat; Future -USPSTF grade A and B recommendations reviewed with patient; age-appropriate recommendations, preventive care, screening tests, etc discussed and encouraged; healthy living encouraged; see AVS for patient education given to patient -Discussed importance of 150 minutes of physical activity weekly, eat two servings of fish weekly, eat one serving of tree nuts ( cashews, pistachios, pecans, almonds.Marland Kitchen) every other day, eat 6 servings of fruit/vegetables daily and drink plenty of water and avoid sweet beverages.

## 2020-06-09 ENCOUNTER — Encounter: Payer: Self-pay | Admitting: Family Medicine

## 2020-06-09 ENCOUNTER — Ambulatory Visit (INDEPENDENT_AMBULATORY_CARE_PROVIDER_SITE_OTHER): Payer: 59 | Admitting: Family Medicine

## 2020-06-09 ENCOUNTER — Other Ambulatory Visit: Payer: Self-pay

## 2020-06-09 VITALS — BP 126/78 | HR 88 | Temp 98.1°F | Resp 16 | Ht 70.0 in | Wt 165.0 lb

## 2020-06-09 DIAGNOSIS — Z113 Encounter for screening for infections with a predominantly sexual mode of transmission: Secondary | ICD-10-CM

## 2020-06-09 DIAGNOSIS — Z1231 Encounter for screening mammogram for malignant neoplasm of breast: Secondary | ICD-10-CM | POA: Diagnosis not present

## 2020-06-09 DIAGNOSIS — Z0000 Encounter for general adult medical examination without abnormal findings: Secondary | ICD-10-CM | POA: Diagnosis not present

## 2020-06-09 DIAGNOSIS — I200 Unstable angina: Secondary | ICD-10-CM | POA: Diagnosis not present

## 2020-06-09 DIAGNOSIS — E039 Hypothyroidism, unspecified: Secondary | ICD-10-CM

## 2020-06-09 DIAGNOSIS — R928 Other abnormal and inconclusive findings on diagnostic imaging of breast: Secondary | ICD-10-CM

## 2020-06-09 DIAGNOSIS — E559 Vitamin D deficiency, unspecified: Secondary | ICD-10-CM

## 2020-06-09 DIAGNOSIS — D649 Anemia, unspecified: Secondary | ICD-10-CM

## 2020-06-09 DIAGNOSIS — Z1159 Encounter for screening for other viral diseases: Secondary | ICD-10-CM

## 2020-06-09 DIAGNOSIS — I252 Old myocardial infarction: Secondary | ICD-10-CM | POA: Diagnosis not present

## 2020-06-09 DIAGNOSIS — N6320 Unspecified lump in the left breast, unspecified quadrant: Secondary | ICD-10-CM

## 2020-06-09 DIAGNOSIS — N6002 Solitary cyst of left breast: Secondary | ICD-10-CM

## 2020-06-09 DIAGNOSIS — N6001 Solitary cyst of right breast: Secondary | ICD-10-CM

## 2020-06-10 LAB — COMPLETE METABOLIC PANEL WITH GFR
AG Ratio: 1.7 (calc) (ref 1.0–2.5)
ALT: 11 U/L (ref 6–29)
AST: 15 U/L (ref 10–35)
Albumin: 4.4 g/dL (ref 3.6–5.1)
Alkaline phosphatase (APISO): 66 U/L (ref 31–125)
BUN: 7 mg/dL (ref 7–25)
CO2: 31 mmol/L (ref 20–32)
Calcium: 9.9 mg/dL (ref 8.6–10.2)
Chloride: 102 mmol/L (ref 98–110)
Creat: 0.62 mg/dL (ref 0.50–1.10)
GFR, Est African American: 125 mL/min/{1.73_m2} (ref >=60)
GFR, Est Non African American: 108 mL/min/{1.73_m2} (ref >=60)
Globulin: 2.6 g/dL (calc) (ref 1.9–3.7)
Glucose, Bld: 82 mg/dL (ref 65–99)
Potassium: 3.8 mmol/L (ref 3.5–5.3)
Sodium: 139 mmol/L (ref 135–146)
Total Bilirubin: 0.5 mg/dL (ref 0.2–1.2)
Total Protein: 7 g/dL (ref 6.1–8.1)

## 2020-06-10 LAB — CBC WITH DIFFERENTIAL/PLATELET
Absolute Monocytes: 383 cells/uL (ref 200–950)
Basophils Absolute: 30 cells/uL (ref 0–200)
Basophils Relative: 0.7 %
Eosinophils Absolute: 39 cells/uL (ref 15–500)
Eosinophils Relative: 0.9 %
HCT: 35 % (ref 35.0–45.0)
Hemoglobin: 11.2 g/dL — ABNORMAL LOW (ref 11.7–15.5)
Lymphs Abs: 1668 cells/uL (ref 850–3900)
MCH: 28.6 pg (ref 27.0–33.0)
MCHC: 32 g/dL (ref 32.0–36.0)
MCV: 89.3 fL (ref 80.0–100.0)
MPV: 11.4 fL (ref 7.5–12.5)
Monocytes Relative: 8.9 %
Neutro Abs: 2180 cells/uL (ref 1500–7800)
Neutrophils Relative %: 50.7 %
Platelets: 296 10*3/uL (ref 140–400)
RBC: 3.92 10*6/uL (ref 3.80–5.10)
RDW: 12 % (ref 11.0–15.0)
Total Lymphocyte: 38.8 %
WBC: 4.3 10*3/uL (ref 3.8–10.8)

## 2020-06-10 LAB — IRON,TIBC AND FERRITIN PANEL
%SAT: 14 % (calc) — ABNORMAL LOW (ref 16–45)
Ferritin: 4 ng/mL — ABNORMAL LOW (ref 16–232)
Iron: 54 ug/dL (ref 40–190)
TIBC: 383 mcg/dL (calc) (ref 250–450)

## 2020-06-10 LAB — LIPID PANEL
Cholesterol: 107 mg/dL (ref <200)
HDL: 48 mg/dL — ABNORMAL LOW (ref >=50)
LDL Cholesterol (Calc): 47 mg/dL (calc)
Non-HDL Cholesterol (Calc): 59 mg/dL (calc) (ref <130)
Total CHOL/HDL Ratio: 2.2 (calc) (ref <5.0)
Triglycerides: 42 mg/dL (ref <150)

## 2020-06-10 LAB — HEPATITIS C ANTIBODY
Hepatitis C Ab: NONREACTIVE
SIGNAL TO CUT-OFF: 0.02 (ref <1.00)

## 2020-06-10 LAB — RPR: RPR Ser Ql: NONREACTIVE

## 2020-06-10 LAB — HIV ANTIBODY (ROUTINE TESTING W REFLEX): HIV 1&2 Ab, 4th Generation: NONREACTIVE

## 2020-06-14 ENCOUNTER — Encounter: Payer: Self-pay | Admitting: Family Medicine

## 2020-06-15 ENCOUNTER — Other Ambulatory Visit: Payer: Self-pay | Admitting: Physician Assistant

## 2020-06-15 ENCOUNTER — Other Ambulatory Visit: Payer: Self-pay | Admitting: Family Medicine

## 2020-06-15 MED ORDER — FERROUS SULFATE 325 (65 FE) MG PO TBEC: 325.0000 mg | DELAYED_RELEASE_TABLET | Freq: Three times a day (TID) | ORAL | 5 refills | Status: DC

## 2020-06-21 ENCOUNTER — Encounter: Payer: Self-pay | Admitting: Family Medicine

## 2020-11-05 NOTE — Progress Notes (Signed)
CARDIOLOGY OFFICE NOTE  Date:  11/15/2020    Stefanie [name] Date of Birth: Dec 29, 1975 Medical Record #[id number]  PCP:  Steele Sizer, MD  Cardiologist:  Johnsie Cancel  No chief complaint on file.   History of Present Illness: Stefanie [name] is a 45 y.o. 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  Otherwise had normal left main, LAD, LCX and RCA along with   normal LV function.  It was recommended to be on beta-blocker therapy and blood pressure control to less than 120/80.  Plavix and aspirin added for at least 6 months along with statin therapy. She was to consider vascular screening as well for fibromuscular dysplasia. Sed rate and CRP were normal.   She was seen by Ermalinda Barrios, PA 02/24/20 . Very fatigued on metoprolol and this was changed to Toprol.   Back to the ER 03/22/20 with chest pain but left AMA.  HS troponin was negative x 2.   Seen by NP 03/23/20 and better with Toprol Had renal duplex with some elevation in left mid renal artery but non obstructive and not FMD looking Carotid dopplers normal   Son [name] is 4 she home schools and he was with her today  She works per diem 2/month at [employer] for a living Mom is nearby and helps out   Primary has checked labs and LDL at goal   Past Medical History:  Diagnosis Date  . Benign neoplasm of thyroid gland   . Breast cyst 2014  . Osteopenia   . Thyroid disease    hypothyroidism  . Vitamin D deficiency 03/25/2018    Past Surgical History:  Procedure Laterality Date  . BREAST SURGERY     left aspiration   . CESAREAN SECTION  07/20/2016  . LEFT HEART CATH AND CORONARY ANGIOGRAPHY N/A 02/09/2020   Procedure: LEFT HEART CATH AND CORONARY ANGIOGRAPHY;  Surgeon: Belva Crome, MD;  Location: [facility];  Service: Cardiovascular;   Laterality: N/A;     Medications: No outpatient medications have been marked as taking for the 11/15/20 encounter (Appointment) with Josue Hector, MD.     Allergies: Allergies  Allergen Reactions  . Fosamax [Alendronate] Other (See Comments)    stomachache  . Latex Itching  . Reclast [Zoledronic Acid] Rash    Social History: The patient  reports that she has never smoked. She has never used smokeless tobacco. She reports that she does not drink alcohol and does not use drugs.   Family History: The patient's family history includes Alzheimer's disease in her paternal grandmother; CVA (age of onset: 72) in her father; Cancer (age of onset: 48) in her maternal grandmother; Cancer - Colon (age of onset: 63) in her mother; Diabetes Mellitus II in her father; Hyperlipidemia in her mother; Hypertension in her mother.   Review of Systems: Please see the history of present illness.   All other systems are reviewed and negative.   Physical Exam: VS:  There were no vitals taken for this visit. Marland Kitchen  BMI There is no height or weight on file to calculate BMI.  Wt Readings from Last 3 Encounters:  06/09/20 74.8 kg  04/30/20 76.8 kg  03/23/20 75.5 kg    Affect appropriate Healthy:  appears stated age 23: normal Neck supple with no adenopathy JVP normal no bruits no  thyromegaly Lungs clear with no wheezing and good diaphragmatic motion Heart:  S1/S2 no murmur, no rub, gallop or click PMI normal Abdomen: benighn, BS positve, no tenderness, no AAA no bruit.  No HSM or HJR Distal pulses intact with no bruits No edema Neuro non-focal Skin warm and dry No muscular weakness    LABORATORY DATA:  EKG:  03/23/20  NSR normal no acute changes 11/15/2020 SR rate 67 normal    Lab Results  Component Value Date   WBC 4.3 06/09/2020   HGB 11.2 (L) 06/09/2020   HCT 35.0 06/09/2020   PLT 296 06/09/2020   GLUCOSE 82 06/09/2020   CHOL 107 06/09/2020   TRIG 42 06/09/2020   HDL 48 (L)  06/09/2020   LDLCALC 47 06/09/2020   ALT 11 06/09/2020   AST 15 06/09/2020   NA 139 06/09/2020   K 3.8 06/09/2020   CL 102 06/09/2020   CREATININE 0.62 06/09/2020   BUN 7 06/09/2020   CO2 31 06/09/2020   TSH 0.466 02/09/2020   HGBA1C 5.6 02/09/2020     BNP (last 3 results) No results for input(s): BNP in the last 8760 hours.  ProBNP (last 3 results) No results for input(s): PROBNP in the last 8760 hours.   Other Studies Reviewed Today:  Vascuscreen 11/15/20  Summary:  Right Carotid: Normal: little or no evidence of plaque visualized in the  right internal carotid artery.  Left Carotid: Normal: little or no evidence of plaque visualized in the  left internal carotid artery.    Renal:    Right: Normal size right kidney. Normal right Resisitive Index.     Normal cortical thickness of right kidney. No evidence of     right renal artery stenosis. RRV flow present.  Left: No evidence of left renal artery stenosis. LRV flow present.     Normal size of left kidney. Normal left Resistive Index.     Normal cortical thickness of the left kidney. 1-59% stenosis     of the left renal artery.  Mesenteric:  Normal Celiac artery and Superior Mesenteric artery findings.    No obvious FMD by color Doppler.     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  Patient is asymptomatic at the time of cath.  Normal left main  Normal LAD  Otherwise normal circumflex  Dominant normal right coronary artery  Normal LV systolic function and end-diastolic pressure.  RECOMMENDATIONS:   Beta-blocker therapy and blood pressure control to less than 120/80 mmHg.  Renin angiotensin blockade or calcium channel blocker therapy would be considerations in addition to beta-blocker if  needed.  Aspirin and Plavix for least 6 months.  Statin therapy  Avoid full anticoagulation.  Consider screening carotid and renal arteries for evidence of fibromuscular dysplasia  We will check hs-CRP and sed rate.    ECHO IMPRESSIONS 01/2020  1. Left ventricular ejection fraction, by estimation, is 55 to 60%. The  left ventricle has normal function. The left ventricle has no regional  wall motion abnormalities. Left ventricular diastolic parameters were  normal.  2. Right ventricular systolic function is normal. The right ventricular  size is normal.  3. The mitral valve is normal in structure. Trivial mitral valve  regurgitation. No evidence of mitral stenosis.  4. The aortic valve is tricuspid. Aortic valve regurgitation is not  visualized.  No aortic stenosis is present.  5. The inferior vena cava is normal in size with greater than 50%  respiratory variability, suggesting right atrial pressure of 3 mmHg.      ASSESSMENT & PLAN:    1.Coronary Disection: -July 2021 stable with no recurrence . D/c plavix   2. HLD: - on statin - LDL 47 06/09/20   3. Fatigue: - this has improved with the change to long acting Toprol - we will continue.   4. Mild RAS: - normal creatinine - no FMD noted - would follow and manage risk factors for CV disease.   5. Thyroid:  On synthroid replacement TSH normal   Current medicines are reviewed with the patient today.  The patient does not have concerns regarding medicines other than what has been noted above.  The following changes have been made:  See above.  Labs/ tests ordered today include:    No orders of the defined types were placed in this encounter.    Disposition:   FU with me in a year consider f/u renal duplex then and stopping beta blocker D/c plavix today    Patient is agreeable to this plan and will call if any problems develop in the interim.   Signed: Jenkins Rouge, MD  11/15/2020 3:35 PM  [facility] [address] [place], [place]  [address] Phone: [phone] Fax: [phone]

## 2020-11-15 ENCOUNTER — Ambulatory Visit: Payer: 59 | Admitting: Cardiovascular Disease

## 2020-11-15 ENCOUNTER — Other Ambulatory Visit: Payer: Self-pay

## 2020-11-15 ENCOUNTER — Encounter: Payer: Self-pay | Admitting: Cardiovascular Disease

## 2020-11-15 VITALS — BP 110/72 | HR 67 | Ht 69.5 in | Wt 172.0 lb

## 2020-11-15 DIAGNOSIS — I2542 Coronary artery dissection: Secondary | ICD-10-CM | POA: Diagnosis not present

## 2020-11-15 DIAGNOSIS — I2510 Atherosclerotic heart disease of native coronary artery without angina pectoris: Secondary | ICD-10-CM | POA: Diagnosis not present

## 2020-11-15 NOTE — Patient Instructions (Signed)
Medication Instructions:  STOP CLODPIDGREL *If you need a refill on your cardiac medications before your next appointment, please call your pharmacy*   Lab Work: NONE If you have labs (blood work) drawn today and your tests are completely normal, you will receive your results only by: Marland Kitchen MyChart Message (if you have MyChart) OR . A paper copy in the mail If you have any lab test that is abnormal or we need to change your treatment, we will call you to review the results.   Testing/Procedures: NONE   Follow-Up: At [facility], you and your health needs are our priority.  As part of our continuing mission to provide you with exceptional heart care, we have created designated Provider Care Teams.  These Care Teams include your primary Cardiologist (physician) and Advanced Practice Providers (APPs -  Physician Assistants and Nurse Practitioners) who all work together to provide you with the care you need, when you need it.  We recommend signing up for the patient portal called "MyChart".  Sign up information is provided on this After Visit Summary.  MyChart is used to connect with patients for Virtual Visits (Telemedicine).  Patients are able to view lab/test results, encounter notes, upcoming appointments, etc.  Non-urgent messages can be sent to your provider as well.   To learn more about what you can do with MyChart, go to [email or web address].    Your next appointment:   12 month(s)  The format for your next appointment:   In Person  Provider:   Jenkins Rouge, MD   Other Instructions NONE

## 2020-12-02 ENCOUNTER — Other Ambulatory Visit: Payer: Self-pay | Admitting: Family Medicine

## 2020-12-02 DIAGNOSIS — Z1231 Encounter for screening mammogram for malignant neoplasm of breast: Secondary | ICD-10-CM

## 2020-12-13 ENCOUNTER — Encounter: Payer: Self-pay | Admitting: Family Medicine

## 2021-01-26 DIAGNOSIS — N939 Abnormal uterine and vaginal bleeding, unspecified: Secondary | ICD-10-CM | POA: Insufficient documentation

## 2021-03-04 ENCOUNTER — Other Ambulatory Visit: Payer: Self-pay | Admitting: Cardiovascular Disease

## 2021-06-10 ENCOUNTER — Encounter: Payer: 59 | Admitting: Family Medicine

## 2021-06-14 ENCOUNTER — Other Ambulatory Visit: Payer: Self-pay | Admitting: Physician Assistant

## 2021-06-14 NOTE — Progress Notes (Signed)
Name: Stefanie [name]   MRN: [id number]    DOB: [birth year]   Date:06/15/2021       Progress Note  Subjective  Chief Complaint  Annual Exam  HPI  Patient presents for annual CPE.  Diet: rich in fruit and vegetables, cutting down on carbohydrates Exercise:  continue regular physical activity   [facility] Office Visit from 06/03/2018 in [facility]  AUDIT-C Score 0      Depression: Phq 9 is  negative Depression screen [facility] 2/9 06/15/2021 06/09/2020 06/09/2020 06/09/2019 06/03/2018  Decreased Interest 0 0 0 0 0  Down, Depressed, Hopeless 0 0 0 0 0  PHQ - 2 Score 0 0 0 0 0  Altered sleeping 1 0 - 0 0  Tired, decreased energy 0 0 - 0 0  Change in appetite 0 0 - 0 0  Feeling bad or failure about yourself  0 0 - 0 0  Trouble concentrating 0 0 - 0 0  Moving slowly or fidgety/restless 0 0 - 0 0  Suicidal thoughts 0 0 - 0 0  PHQ-9 Score 1 0 - 0 0  Difficult doing work/chores - Not difficult at all - Not difficult at all Not difficult at all   Hypertension: BP Readings from Last 3 Encounters:  06/15/21 112/76  11/15/20 110/72  06/09/20 126/78   Obesity: Wt Readings from Last 3 Encounters:  06/15/21 166 lb (75.3 kg)  11/15/20 172 lb (78 kg)  06/09/20 165 lb (74.8 kg)   BMI Readings from Last 3 Encounters:  06/15/21 23.82 kg/m  11/15/20 25.04 kg/m  06/09/20 23.68 kg/m     Vaccines:   Pneumonia: educated and discussed with patient. Flu:up to date COVID-19: needs the booster but not interested at this time  Hep C Screening: 06/09/20 STD testing and prevention (HIV/chl/gon/syphilis): 06/09/20 Intimate partner violence: negative Sexual History :no problems, same partner  Menstrual History/LMP/Abnormal Bleeding: she saw gyn in [place] for DUB , she bled for 2 weeks , seen by GYN, and was supposed to get an [place] but she did not go back after bleeding stopped  Incontinence Symptoms: no problems   Breast cancer:  - Last Mammogram: Ordered 12/02/20, she  had a category 3 of right breast and advised to have repeat in 6 months but lost to follow up, she goes to [place] and we will place an order today  - BRCA gene screening: positive family for colon cancer, mother and maternal grandmother. Discussed genetic testing. Not interested at this time  Osteoporosis: she has bone density done by Dr. Ronnald Collum   Cervical cancer screening: Today  Skin cancer: Discussed monitoring for atypical lesions  Colorectal cancer: 03/13/12   Lung cancer: Low Dose CT Chest recommended if Age 26-80 years, 20 pack-year currently smoking OR have quit w/in 15years. Patient does not qualify.   ECG: 11/15/20  Advanced Care Planning: A voluntary discussion about advance care planning including the explanation and discussion of advance directives.  Discussed health care proxy and Living will, and the patient was able to identify a health care proxy as husband.  Patient does not have a living will at present time. If patient does have living will, I have requested they bring this to the clinic to be scanned in to their chart.  Lipids: Lab Results  Component Value Date   CHOL 107 06/09/2020   CHOL 155 02/10/2020   CHOL 150 05/22/2019   Lab Results  Component Value Date   HDL 48 (L) 06/09/2020  HDL 51 02/10/2020   HDL 50 05/22/2019   Lab Results  Component Value Date   LDLCALC 47 06/09/2020   LDLCALC 96 02/10/2020   LDLCALC 89 05/22/2019   Lab Results  Component Value Date   TRIG 42 06/09/2020   TRIG 42 02/10/2020   TRIG 49 05/22/2019   Lab Results  Component Value Date   CHOLHDL 2.2 06/09/2020   CHOLHDL 3.0 02/10/2020   CHOLHDL 3.4 06/03/2018   No results found for: LDLDIRECT  Glucose: Glucose, Bld  Date Value Ref Range Status  06/09/2020 82 65 - 99 mg/dL Final    Comment:    .            Fasting reference interval .   03/22/2020 102 (H) 70 - 99 mg/dL Final    Comment:    Glucose reference range applies only to samples taken after fasting for  at least 8 hours.  02/10/2020 96 70 - 99 mg/dL Final    Comment:    Glucose reference range applies only to samples taken after fasting for at least 8 hours.    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   Vitamin D deficiency 03/25/2018   Hypothyroidism 03/13/4817   Umbilical hernia 56/31/4970   History of osteoporosis 05/16/2017   Thyroid nodule 05/16/2017   Osteoporosis 05/16/2017   Uterine leiomyoma 03/07/2016    Past Surgical History:  Procedure Laterality Date   BREAST SURGERY     left aspiration    CESAREAN SECTION  07/20/2016   LEFT HEART CATH AND CORONARY ANGIOGRAPHY N/A 02/09/2020   Procedure: LEFT HEART CATH AND CORONARY ANGIOGRAPHY;  Surgeon: Belva Crome, MD;  Location: [facility];  Service: Cardiovascular;  Laterality: N/A;    Family History  Problem Relation Age of Onset   Cancer - Colon Mother 32   Hyperlipidemia Mother    Hypertension Mother    Diabetes Mellitus II Father    CVA Father 97   Cancer Maternal Grandmother 53   Alzheimer's disease Paternal Grandmother     Social History   Socioeconomic History   Marital status: Married    Spouse name: Colette Dicamillo   Number of children: 1   Years of education: 14   Highest education level: Associate degree: academic program  Occupational History    Comment: prn for health unit coordinator   Tobacco Use   Smoking status: Never   Smokeless tobacco: Never  Vaping Use   Vaping Use: Never used  Substance and Sexual Activity   Alcohol use: No   Drug use: No   Sexual activity: Yes    Partners: Male    Birth  control/protection: None  Other Topics Concern   Not on file  Social History Narrative   Married since 2015, they have one child born 07/20/2016   Works part time at [employer] Strain: Low Risk    Difficulty of Paying Living Expenses: Not hard at all  Food Insecurity: No Food Insecurity   Worried About Charity fundraiser in the Last Year: Never true  Ran Out of Food in the Last Year: Never true  Transportation Needs: No Transportation Needs   Lack of Transportation (Medical): No   Lack of Transportation (Non-Medical): No  Physical Activity: Sufficiently Active   Days of Exercise per Week: 3 days   Minutes of Exercise per Session: 80 min  Stress: No Stress Concern Present   Feeling of Stress : Not at all  Social Connections: Socially Integrated   Frequency of Communication with Friends and Family: More than three times a week   Frequency of Social Gatherings with Friends and Family: Twice a week   Attends Religious Services: More than 4 times per year   Active Member of [employer] or Organizations: Yes   Attends Music therapist: More than 4 times per year   Marital Status: Married  Human resources officer Violence: Not At Risk   Fear of Current or Ex-Partner: No   Emotionally Abused: No   Physically Abused: No   Sexually Abused: No     Current Outpatient Medications:    aspirin 81 MG chewable tablet, Chew 1 tablet (81 mg total) by mouth daily., Disp: , Rfl:    atorvastatin (LIPITOR) 10 MG tablet, TAKE 1 TABLET BY MOUTH EVERY DAY, Disp: 30 tablet, Rfl: 11   ferrous sulfate 325 (65 FE) MG EC tablet, Take 1 tablet (325 mg total) by mouth 3 (three) times daily with meals., Disp: 90 tablet, Rfl: 5   levothyroxine (SYNTHROID, LEVOTHROID) 100 MCG tablet, Take 100 mcg by mouth daily before breakfast. , Disp: , Rfl:    metoprolol succinate (TOPROL-XL) 25 MG 24 hr tablet, TAKE 1 TABLET BY MOUTH EVERY DAY, Disp: 30 tablet, Rfl: 5    Multiple Vitamin (MULTIVITAMIN ADULT PO), Take 1 tablet by mouth daily., Disp: , Rfl:    nitroGLYCERIN (NITROSTAT) 0.4 MG SL tablet, Place 1 tablet (0.4 mg total) under the tongue every 5 (five) minutes x 3 doses as needed for chest pain., Disp: 25 tablet, Rfl: 0   Vitamin D, Ergocalciferol, (DRISDOL) 50000 units CAPS capsule, Take 50,000 Units by mouth once a week. Wednesdays, Disp: , Rfl: 1  Allergies  Allergen Reactions   Fosamax [Alendronate] Other (See Comments)    stomachache   Latex Itching   Reclast [Zoledronic Acid] Rash     ROS  Constitutional: Negative for fever or weight change.  Respiratory: Negative for cough and shortness of breath.   Cardiovascular: Negative for chest pain or palpitations.  Gastrointestinal: Negative for abdominal pain, no bowel changes.  Musculoskeletal: Negative for gait problem or joint swelling.  Skin: Negative for rash.  Neurological: Negative for dizziness or headache.  No other specific complaints in a complete review of systems (except as listed in HPI above).   Objective  Vitals:   06/15/21 0951  BP: 112/76  Pulse: 73  Resp: 16  Temp: 98.2 F (36.8 C)  SpO2: 99%  Weight: 166 lb (75.3 kg)  Height: $Remove'5\' 10"'RitDxsI$  (1.778 m)    Body mass index is 23.82 kg/m.  Physical Exam  Constitutional: Patient appears well-developed and well-nourished. No distress.  HENT: Head: Normocephalic and atraumatic. Ears: B TMs ok, no erythema or effusion; Nose: Nose normal. Mouth/Throat: Oropharynx is clear and moist. No oropharyngeal exudate.  Eyes: Conjunctivae and EOM are normal. Pupils are equal, round, and reactive to light. No scleral icterus.  Neck: Normal range of motion. Neck supple. No JVD present. No thyromegaly present.  Cardiovascular: Normal rate, regular rhythm and normal heart sounds.  No murmur heard. No  BLE edema. Pulmonary/Chest: Effort normal and breath sounds normal. No respiratory distress. Abdominal: Soft. Bowel sounds are normal, no  distension. There is no tenderness. no masses Breast: large round mass on left breast at 3 o'clock, and also firm mass at 11 o'clock right breast, no nipple discharge or rashes FEMALE GENITALIA:  External genitalia normal External urethra normal Vaginal vault normal without discharge or lesions Cervix normal , clear discharge or lesions Bimanual exam normal without masses RECTAL: not done  Musculoskeletal: Normal range of motion, no joint effusions. No gross deformities Neurological: he is alert and oriented to person, place, and time. No cranial nerve deficit. Coordination, balance, strength, speech and gait are normal.  Skin: Skin is warm and dry. No rash noted. No erythema.  Psychiatric: Patient has a normal mood and affect. behavior is normal. Judgment and thought content normal.   Fall Risk: Fall Risk  06/15/2021 06/09/2020 06/09/2019 06/03/2018 10/10/2017  Falls in the past year? 0 0 0 0 No  Number falls in past yr: 0 0 0 - -  Injury with Fall? 0 0 0 - -  Risk for fall due to : No Fall Risks - - - -  Follow up Falls prevention discussed - - - -     Functional Status Survey: Is the patient deaf or have difficulty hearing?: No Does the patient have difficulty seeing, even when wearing glasses/contacts?: No Does the patient have difficulty concentrating, remembering, or making decisions?: No Does the patient have difficulty walking or climbing stairs?: No Does the patient have difficulty dressing or bathing?: No Does the patient have difficulty doing errands alone such as visiting a doctor's office or shopping?: No   Assessment & Plan  1. Well adult exam  She will send me a copy of recent labs done at Dr. Ronnald Collum   2. Cervical cancer screening  - Cytology - PAP  3. Needs flu shot   4. Colon cancer screening  - Ambulatory referral to Gastroenterology  5. Family history of colon cancer in mother  - Ambulatory referral to Gastroenterology   -USPSTF grade A and B  recommendations reviewed with patient; age-appropriate recommendations, preventive care, screening tests, etc discussed and encouraged; healthy living encouraged; see AVS for patient education given to patient -Discussed importance of 150 minutes of physical activity weekly, eat two servings of fish weekly, eat one serving of tree nuts ( cashews, pistachios, pecans, almonds.Marland Kitchen) every other day, eat 6 servings of fruit/vegetables daily and drink plenty of water and avoid sweet beverages.

## 2021-06-15 ENCOUNTER — Ambulatory Visit (INDEPENDENT_AMBULATORY_CARE_PROVIDER_SITE_OTHER): Payer: 59 | Admitting: Family Medicine

## 2021-06-15 ENCOUNTER — Other Ambulatory Visit (HOSPITAL_COMMUNITY)
Admission: RE | Admit: 2021-06-15 | Discharge: 2021-06-15 | Disposition: A | Discharge status: Home / Self Care | Payer: 59 | Source: Ambulatory Visit | Attending: Family Medicine | Admitting: Family Medicine

## 2021-06-15 ENCOUNTER — Encounter: Payer: Self-pay | Admitting: Family Medicine

## 2021-06-15 ENCOUNTER — Other Ambulatory Visit: Payer: Self-pay

## 2021-06-15 VITALS — BP 112/76 | HR 73 | Temp 98.2°F | Resp 16 | Ht 70.0 in | Wt 166.0 lb

## 2021-06-15 DIAGNOSIS — Z1211 Encounter for screening for malignant neoplasm of colon: Secondary | ICD-10-CM

## 2021-06-15 DIAGNOSIS — Z0000 Encounter for general adult medical examination without abnormal findings: Secondary | ICD-10-CM

## 2021-06-15 DIAGNOSIS — Z23 Encounter for immunization: Secondary | ICD-10-CM | POA: Diagnosis not present

## 2021-06-15 DIAGNOSIS — Z124 Encounter for screening for malignant neoplasm of cervix: Secondary | ICD-10-CM

## 2021-06-15 DIAGNOSIS — N6325 Unspecified lump in the left breast, overlapping quadrants: Secondary | ICD-10-CM

## 2021-06-15 DIAGNOSIS — R928 Other abnormal and inconclusive findings on diagnostic imaging of breast: Secondary | ICD-10-CM

## 2021-06-15 DIAGNOSIS — Z800 Family history of malignant neoplasm of digestive organs: Secondary | ICD-10-CM

## 2021-06-21 ENCOUNTER — Other Ambulatory Visit: Payer: Self-pay

## 2021-06-21 ENCOUNTER — Telehealth: Payer: Self-pay

## 2021-06-21 ENCOUNTER — Encounter: Payer: Self-pay | Admitting: Nurse Practitioner

## 2021-06-21 ENCOUNTER — Telehealth (INDEPENDENT_AMBULATORY_CARE_PROVIDER_SITE_OTHER): Payer: 59 | Admitting: Nurse Practitioner

## 2021-06-21 ENCOUNTER — Encounter: Payer: Self-pay | Admitting: Family Medicine

## 2021-06-21 VITALS — Temp 101.0°F

## 2021-06-21 DIAGNOSIS — J069 Acute upper respiratory infection, unspecified: Secondary | ICD-10-CM | POA: Diagnosis not present

## 2021-06-21 DIAGNOSIS — Z800 Family history of malignant neoplasm of digestive organs: Secondary | ICD-10-CM

## 2021-06-21 DIAGNOSIS — Z1211 Encounter for screening for malignant neoplasm of colon: Secondary | ICD-10-CM

## 2021-06-21 LAB — CYTOLOGY - PAP
Comment: NEGATIVE
Diagnosis: NEGATIVE
High risk HPV: NEGATIVE

## 2021-06-21 MED ORDER — NA SULFATE-K SULFATE-MG SULF 17.5-3.13-1.6 GM/177ML PO SOLN: 1.0000 | Freq: Once | ORAL | 0 refills | Status: AC

## 2021-06-21 MED ORDER — BENZONATATE 100 MG PO CAPS: 200.0000 mg | ORAL_CAPSULE | Freq: Two times a day (BID) | ORAL | 0 refills | Status: DC | PRN

## 2021-06-21 NOTE — Telephone Encounter (Signed)
Pt has an appt today with Serafina Royals FNP     Copied from [place] [id number]. Topic: General - Other >> Jun 21, 2021  9:33 AM Holley Dexter N wrote: Reason for CRM: Pt called in wanted to know if she could get a covid or flu test done, and wanted to speak with someone to see how she can go about that, please advise.

## 2021-06-21 NOTE — Progress Notes (Signed)
Gastroenterology Pre-Procedure Review  Request Date: 08/04/2021 Requesting Physician: Dr. Marius Ditch  PATIENT REVIEW QUESTIONS: The patient responded to the following health history questions as indicated:    1. Are you having any GI issues? no 2. Do you have a personal history of Polyps? no 3. Do you have a family history of Colon Cancer or Polyps? yes (Mother- colon cancer) 4. Diabetes Mellitus? no 5. Joint replacements in the past 12 months?no 6. Major health problems in the past 3 months?no 7. Any artificial heart valves, MVP, or defibrillator?no    MEDICATIONS & ALLERGIES:    Patient reports the following regarding taking any anticoagulation/antiplatelet therapy:   Plavix, Coumadin, Eliquis, Xarelto, Lovenox, Pradaxa, Brilinta, or Effient? no Aspirin? yes (81 mg)  Patient confirms/reports the following medications:  Current Outpatient Medications  Medication Sig Dispense Refill   aspirin 81 MG chewable tablet Chew 1 tablet (81 mg total) by mouth daily.     atorvastatin (LIPITOR) 10 MG tablet TAKE 1 TABLET BY MOUTH EVERY DAY 30 tablet 11   benzonatate (TESSALON) 100 MG capsule Take 2 capsules (200 mg total) by mouth 2 (two) times daily as needed for cough. 20 capsule 0   ferrous sulfate 325 (65 FE) MG EC tablet Take 1 tablet (325 mg total) by mouth 3 (three) times daily with meals. 90 tablet 5   levothyroxine (SYNTHROID, LEVOTHROID) 100 MCG tablet Take 100 mcg by mouth daily before breakfast.      metoprolol succinate (TOPROL-XL) 25 MG 24 hr tablet TAKE 1 TABLET BY MOUTH EVERY DAY 30 tablet 5   Multiple Vitamin (MULTIVITAMIN ADULT PO) Take 1 tablet by mouth daily.     nitroGLYCERIN (NITROSTAT) 0.4 MG SL tablet Place 1 tablet (0.4 mg total) under the tongue every 5 (five) minutes x 3 doses as needed for chest pain. 25 tablet 0   Vitamin D, Ergocalciferol, (DRISDOL) 50000 units CAPS capsule Take 50,000 Units by mouth once a week. Wednesdays  1   No current facility-administered  medications for this visit.    Patient confirms/reports the following allergies:  Allergies  Allergen Reactions   Fosamax [Alendronate] Other (See Comments)    stomachache   Latex Itching   Reclast [Zoledronic Acid] Rash    No orders of the defined types were placed in this encounter.   AUTHORIZATION INFORMATION Primary Insurance: 1D#: Group #:  Secondary Insurance: 1D#: Group #:  SCHEDULE INFORMATION: Date: 08/04/2021 Time: Location: [place]

## 2021-06-21 NOTE — Progress Notes (Signed)
Name: Stefanie [name]   MRN: [id number]    DOB: 1976/01/01   Date:06/21/2021       Progress Note  Subjective  Chief Complaint  Chief Complaint  Patient presents with   URI    Chills, fever, headache and coughing, sore throat since yesterday. Covid test negative    I connected with  [name]  on 06/21/21 at 12:55 pm by a video enabled telemedicine application and verified that I am speaking with the correct person using two identifiers.  I discussed the limitations of evaluation and management by telemedicine and the availability of in person appointments. The patient expressed understanding and agreed to proceed with a virtual visit  Staff also discussed with the patient that there may be a patient responsible charge related to this service. Patient Location: home Provider Location: [facility] Additional Individuals present: alone  HPI  URI: She says symptoms started yesterday.  She is complaining of fever, headache, cough, sore throat and nasal congestion.  She has taken two home Covid tests and they were both negative.  She denies any shortness of breath. She says she has been taking some cold medicine to help with symptoms.  Discussed PCR covid testing and flu testing.  [facility].  Discussed OTC treatments for symptoms.  Push fluids and will prescribe tessalon perls for cough.   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   Vitamin D deficiency 03/25/2018   Hypothyroidism 92/42/6834   Umbilical hernia 19/62/2297   History of osteoporosis  05/16/2017   Thyroid nodule 05/16/2017   Osteoporosis 05/16/2017   Uterine leiomyoma 03/07/2016    Social History   Tobacco Use   Smoking status: Never   Smokeless tobacco: Never  Substance Use Topics   Alcohol use: No     Current Outpatient Medications:    aspirin 81 MG chewable tablet, Chew 1 tablet (81 mg total) by mouth daily., Disp: , Rfl:    atorvastatin (LIPITOR) 10 MG tablet, TAKE 1 TABLET BY MOUTH EVERY DAY, Disp: 30 tablet, Rfl: 11   ferrous sulfate 325 (65 FE) MG EC tablet, Take 1 tablet (325 mg total) by mouth 3 (three) times daily with meals., Disp: 90 tablet, Rfl: 5   levothyroxine (SYNTHROID, LEVOTHROID) 100 MCG tablet, Take 100 mcg by mouth daily before breakfast. , Disp: , Rfl:    metoprolol succinate (TOPROL-XL) 25 MG 24 hr tablet, TAKE 1 TABLET BY MOUTH EVERY DAY, Disp: 30 tablet, Rfl: 5   Multiple Vitamin (MULTIVITAMIN ADULT PO), Take 1 tablet by mouth daily., Disp: , Rfl:    nitroGLYCERIN (NITROSTAT) 0.4 MG SL tablet, Place 1 tablet (0.4 mg total) under the tongue every 5 (five) minutes x 3 doses as needed for chest pain., Disp: 25 tablet, Rfl: 0   Vitamin D, Ergocalciferol, (DRISDOL) 50000 units CAPS capsule, Take 50,000 Units by mouth once a week. Wednesdays, Disp: , Rfl: 1  Allergies  Allergen Reactions   Fosamax [Alendronate] Other (See Comments)    stomachache   Latex Itching   Reclast [Zoledronic Acid] Rash  I personally reviewed active problem list, medication list, allergies with the patient/caregiver today.  ROS  Constitutional: Positive for fever, negative for weight change.  HEENT: Positive for nasal congestion and sore throat Respiratory: Positive for cough and negative for shortness of breath.   Cardiovascular: Negative for chest pain or palpitations.  Gastrointestinal: Negative for abdominal pain, no bowel changes.  Musculoskeletal: Negative for gait problem or joint swelling.  Skin: Negative for rash.  Neurological: Negative for  dizziness. Positive for headache.  No other specific complaints in a complete review of systems (except as listed in HPI above).   Objective  Virtual encounter, vitals not obtained.  There is no height or weight on file to calculate BMI.  Nursing Note and Vital Signs reviewed.  Physical Exam  Awake, alert and oriented, speaking in complete sentences  No results found for this or any previous visit (from the past 72 hour(s)).  Assessment & Plan  1. Viral upper respiratory tract infection -OTC treatments for symptoms -push fluids -[facility], if positive will send in treatment - benzonatate (TESSALON) 100 MG capsule; Take 2 capsules (200 mg total) by mouth 2 (two) times daily as needed for cough.  Dispense: 20 capsule; Refill: 0   -Red flags and when to present for emergency care or RTC including fever >101.64F, chest pain, shortness of breath, new/worsening/un-resolving symptoms, reviewed with patient at time of visit. Follow up and care instructions discussed and provided in AVS. - I discussed the assessment and treatment plan with the patient. The patient was provided an opportunity to ask questions and all were answered. The patient agreed with the plan and demonstrated an understanding of the instructions.  I provided 15 minutes of non-face-to-face time during this encounter.  Bo Merino, FNP

## 2021-07-13 ENCOUNTER — Encounter: Payer: Self-pay | Admitting: Cardiovascular Disease

## 2021-07-13 MED ORDER — ATORVASTATIN CALCIUM 10 MG PO TABS: 5.0000 mg | ORAL_TABLET | Freq: Every day | ORAL | 3 refills | Status: DC

## 2021-07-26 LAB — HM MAMMOGRAPHY: HM Mammogram: NORMAL (ref 0–4)

## 2021-08-03 ENCOUNTER — Telehealth: Payer: Self-pay | Admitting: Gastroenterology

## 2021-08-03 NOTE — Telephone Encounter (Signed)
Inbound call from pt requesting a call back stating that she has a question about a medication before her procedure. Thank you.

## 2021-08-03 NOTE — Telephone Encounter (Signed)
Returned patients call regarding prep. LVM to call office back.

## 2021-08-04 ENCOUNTER — Other Ambulatory Visit: Payer: Self-pay

## 2021-08-04 ENCOUNTER — Ambulatory Visit: Payer: 59 | Admitting: Certified Registered Nurse Anesthetist

## 2021-08-04 ENCOUNTER — Encounter
Admission: RE | Disposition: A | Discharge status: Home / Self Care | Payer: Self-pay | Source: Ambulatory Visit | Attending: Gastroenterology

## 2021-08-04 ENCOUNTER — Encounter: Payer: Self-pay | Admitting: Gastroenterology

## 2021-08-04 ENCOUNTER — Ambulatory Visit
Admission: RE | Admit: 2021-08-04 | Discharge: 2021-08-04 | Disposition: A | Discharge status: Home / Self Care | Payer: 59 | Source: Ambulatory Visit | Attending: Gastroenterology | Admitting: Gastroenterology

## 2021-08-04 DIAGNOSIS — I2510 Atherosclerotic heart disease of native coronary artery without angina pectoris: Secondary | ICD-10-CM | POA: Insufficient documentation

## 2021-08-04 DIAGNOSIS — Z800 Family history of malignant neoplasm of digestive organs: Secondary | ICD-10-CM | POA: Diagnosis not present

## 2021-08-04 DIAGNOSIS — E039 Hypothyroidism, unspecified: Secondary | ICD-10-CM | POA: Insufficient documentation

## 2021-08-04 DIAGNOSIS — D509 Iron deficiency anemia, unspecified: Secondary | ICD-10-CM | POA: Diagnosis not present

## 2021-08-04 DIAGNOSIS — I252 Old myocardial infarction: Secondary | ICD-10-CM | POA: Diagnosis not present

## 2021-08-04 DIAGNOSIS — E559 Vitamin D deficiency, unspecified: Secondary | ICD-10-CM | POA: Diagnosis not present

## 2021-08-04 DIAGNOSIS — Z1211 Encounter for screening for malignant neoplasm of colon: Secondary | ICD-10-CM | POA: Insufficient documentation

## 2021-08-04 HISTORY — DX: Iron deficiency anemia, unspecified: D50.9

## 2021-08-04 HISTORY — PX: COLONOSCOPY WITH PROPOFOL: SHX5780

## 2021-08-04 HISTORY — DX: Coronary artery dissection: I25.42

## 2021-08-04 LAB — POCT PREGNANCY, URINE: Preg Test, Ur: NEGATIVE

## 2021-08-04 SURGERY — COLONOSCOPY WITH PROPOFOL
Anesthesia: General

## 2021-08-04 MED ORDER — PROPOFOL 10 MG/ML IV BOLUS
INTRAVENOUS | Status: DC | PRN
  Administered 2021-08-04: 70 mg via INTRAVENOUS
  Administered 2021-08-04: 30 mg via INTRAVENOUS

## 2021-08-04 MED ORDER — PROPOFOL 500 MG/50ML IV EMUL
INTRAVENOUS | Status: DC | PRN
  Administered 2021-08-04: 150 ug/kg/min via INTRAVENOUS

## 2021-08-04 MED ORDER — LIDOCAINE HCL (CARDIAC) PF 100 MG/5ML IV SOSY
PREFILLED_SYRINGE | INTRAVENOUS | Status: DC | PRN
  Administered 2021-08-04: 50 mg via INTRAVENOUS

## 2021-08-04 MED ORDER — SODIUM CHLORIDE 0.9 % IV SOLN: INTRAVENOUS | Status: DC

## 2021-08-04 NOTE — H&P (Signed)
Stefanie Darby, MD [address]  [place]  [place], [place] [address]  Main: [phone]  Fax: [phone] Pager: [phone]  Primary Care Physician:  Stefanie Sizer, MD Primary Gastroenterologist:  Dr. Cephas [name]  Pre-Procedure History & Physical: HPI:  Stefanie [name] is a 46 y.o. female is here for an colonoscopy.   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 Authorizing Provider  aspirin 81 MG chewable tablet Chew 1 tablet (81 mg total) by mouth daily. 02/13/20  Yes Stefanie Drown D, NP  atorvastatin (LIPITOR) 10 MG tablet Take 0.5 tablets (5 mg total) by mouth daily. 07/13/21  Yes Stefanie Hector, MD  ferrous sulfate 325 (65 FE) MG EC tablet Take 1 tablet (325 mg total) by mouth 3 (three) times daily with meals. 06/15/20  Yes Stefanie [name], Stefanie Stager, MD  levothyroxine (SYNTHROID, LEVOTHROID) 100 MCG tablet Take 100 mcg by mouth daily before breakfast.    Yes [provider]  metoprolol succinate (TOPROL-XL) 25 MG 24 hr tablet TAKE 1 TABLET BY MOUTH EVERY DAY 06/14/21  Yes Stefanie Hector, MD  Multiple Stefanie (MULTIVITAMIN ADULT PO) Take 1 tablet by mouth daily.   Yes [provider]  Stefanie [name], Ergocalciferol, (DRISDOL) 50000 units CAPS capsule Take 50,000 Units by mouth once a week. Wednesdays 09/15/17  Yes [provider]   benzonatate (TESSALON) 100 MG capsule Take 2 capsules (200 mg total) by mouth 2 (two) times daily as needed for cough. 06/21/21   Stefanie [name], Stefanie [name]  nitroGLYCERIN (NITROSTAT) 0.4 MG SL tablet Place 1 tablet (0.4 mg total) under the tongue every 5 (five) minutes x 3 doses as needed for chest pain. 02/12/20   Stefanie Drown D, NP    Allergies as of 06/21/2021 - Review Complete 06/21/2021  Allergen Reaction Noted   Fosamax [alendronate] Other (See Comments) 06/03/2018   Latex Itching 01/13/2016   Reclast [zoledronic acid] Rash 09/24/2017    Family History  Problem Relation Age of Onset   Cancer - Colon Mother 58   Hyperlipidemia Mother    Hypertension Mother    Diabetes Mellitus II Father    CVA Father 40   Cancer Maternal Grandmother 57   Alzheimer's disease Paternal Grandmother     Social History   Socioeconomic History   Marital status: Married    Spouse name: Stefanie [name]   Number of children: 1   Years of education: 14   Highest education level: Associate degree: academic program  Occupational History    Comment: prn for health unit coordinator   Tobacco Use   Smoking status: Never   Smokeless tobacco: Never  Vaping Use   Vaping Use: Never used  Substance and Sexual Activity   Alcohol use: No   Drug use: No   Sexual  activity: Yes    Partners: Male    Birth control/protection: None  Other Topics Concern   Not on file  Social History Narrative   Married since 2015, they have one child born 07/20/2016   Works part time at [employer]: Low Risk    Difficulty of Paying Living Expenses: Not hard at all  Food Insecurity: No Food Insecurity   Worried About Charity fundraiser in the Last Year: Never true   Arboriculturist in the Last Year: Never true  Transportation Needs: No Transportation Needs   Lack of Transportation (Medical): No   Lack of Transportation (Non-Medical): No  Physical Activity:  Sufficiently Active   Days of Exercise per Week: 3 days   Minutes of Exercise per Session: 80 min  Stress: No Stress Concern Present   Feeling of Stress : Not at all  Social Connections: Socially Integrated   Frequency of Communication with Friends and Family: More than three times a week   Frequency of Social Gatherings with Friends and Family: Twice a week   Attends Religious Services: More than 4 times per year   Active Member of [employer] or Organizations: Yes   Attends Music therapist: More than 4 times per year   Marital Status: Married  Human resources officer Violence: Not At Risk   Fear of Current or Ex-Partner: No   Emotionally Abused: No   Physically Abused: No   Sexually Abused: No    Review of Systems: See HPI, otherwise negative ROS  Physical Exam: BP 107/72    Pulse 74    Temp (!) 97 F (36.1 C) (Temporal)    Resp 18    Ht 5\' 10"  (1.778 m)    Wt 72.1 kg    SpO2 100%    BMI 22.81 kg/m  General:   Alert,  pleasant and cooperative in NAD Head:  Normocephalic and atraumatic. Neck:  Supple; no masses or thyromegaly. Lungs:  Clear throughout to auscultation.    Heart:  Regular rate and rhythm. Abdomen:  Soft, nontender and nondistended. Normal bowel sounds, without guarding, and without rebound.   Neurologic:  Alert and  oriented x4;  grossly normal neurologically.  Impression/Plan: Stefanie [name] is here for an colonoscopy to be performed for family h/o colon cancer in mother  Risks, benefits, limitations, and alternatives regarding  colonoscopy have been reviewed with the patient.  Questions have been answered.  All parties agreeable.   Sherri Sear, MD  08/04/2021, 9:20 AM

## 2021-08-04 NOTE — Transfer of Care (Signed)
Immediate Anesthesia Transfer of Care Note  Patient: Stefanie [name]  Procedure(s) Performed: COLONOSCOPY WITH PROPOFOL  Patient Location: Endoscopy Unit  Anesthesia Type:General  Level of Consciousness: drowsy  Airway & Oxygen Therapy: Patient Spontanous Breathing  Post-op Assessment: Report given to RN and Post -op Vital signs reviewed and stable  Post vital signs: Reviewed and stable  Last Vitals:  Vitals Value Taken Time  BP 105/50 08/04/21 1015  Temp    Pulse 87 08/04/21 1015  Resp 20 08/04/21 1015  SpO2 100 % 08/04/21 1015  Vitals shown include unvalidated device data.  Last Pain:  Vitals:   08/04/21 0907  TempSrc: Temporal  PainSc: 0-No pain         Complications: No notable events documented.

## 2021-08-04 NOTE — Anesthesia Postprocedure Evaluation (Signed)
Anesthesia Post Note  Patient: Stefanie [name]  Procedure(s) Performed: COLONOSCOPY WITH PROPOFOL  Patient location during evaluation: Endoscopy Anesthesia Type: General Level of consciousness: awake and alert Pain management: pain level controlled Vital Signs Assessment: post-procedure vital signs reviewed and stable Respiratory status: spontaneous breathing, nonlabored ventilation, respiratory function stable and patient connected to nasal cannula oxygen Cardiovascular status: blood pressure returned to baseline and stable Postop Assessment: no apparent nausea or vomiting Anesthetic complications: no   No notable events documented.   Last Vitals:  Vitals:   08/04/21 1014 08/04/21 1034  BP: (!) 105/50 (!) 142/43  Pulse: 86   Resp: 11   Temp: (!) 36.1 C   SpO2: 100%     Last Pain:  Vitals:   08/04/21 1044  TempSrc:   PainSc: 0-No pain                 Precious Haws Brinkley Peet

## 2021-08-04 NOTE — Anesthesia Preprocedure Evaluation (Signed)
Anesthesia Evaluation  Patient identified by MRN, date of birth, ID band Patient awake    Reviewed: Allergy & Precautions, NPO status , Patient's Chart, lab work & pertinent test results  History of Anesthesia Complications Negative for: history of anesthetic complications  Airway Mallampati: III  TM Distance: >3 FB Neck ROM: full    Dental  (+) Chipped   Pulmonary neg shortness of breath,    Pulmonary exam normal        Cardiovascular Exercise Tolerance: Good (-) angina+ CAD and + Past MI  (-) DOE Normal cardiovascular exam     Neuro/Psych negative neurological ROS  negative psych ROS   GI/Hepatic negative GI ROS, Neg liver ROS, neg GERD  ,  Endo/Other  Hypothyroidism   Renal/GU negative Renal ROS  negative genitourinary   Musculoskeletal   Abdominal   Peds  Hematology negative hematology ROS (+)   Anesthesia Other Findings Past Medical History: No date: Benign neoplasm of thyroid gland 2014: Breast cyst No date: Iron deficiency anemia No date: Osteopenia No date: Spontaneous dissection of coronary artery No date: Thyroid disease     Comment:  hypothyroidism 03/25/2018: Vitamin D deficiency  Past Surgical History: No date: BREAST SURGERY     Comment:  left aspiration  07/20/2016: CESAREAN SECTION 02/09/2020: LEFT HEART CATH AND CORONARY ANGIOGRAPHY; N/A     Comment:  Procedure: LEFT HEART CATH AND CORONARY ANGIOGRAPHY;                Surgeon: Belva Crome, MD;  Location: [facility];  Service: Cardiovascular;  Laterality: N/A;  BMI    Body Mass Index: 22.81 kg/m      Reproductive/Obstetrics negative OB ROS                             Anesthesia Physical Anesthesia Plan  ASA: 3  Anesthesia Plan: General   Post-op Pain Management:    Induction: Intravenous  PONV Risk Score and Plan: Propofol infusion and TIVA  Airway Management Planned:  Natural Airway and Nasal Cannula  Additional Equipment:   Intra-op Plan:   Post-operative Plan:   Informed Consent: I have reviewed the patients History and Physical, chart, labs and discussed the procedure including the risks, benefits and alternatives for the proposed anesthesia with the patient or authorized representative who has indicated his/her understanding and acceptance.     Dental Advisory Given  Plan Discussed with: Anesthesiologist, CRNA and Surgeon  Anesthesia Plan Comments: (Patient consented for risks of anesthesia including but not limited to:  - adverse reactions to medications - risk of airway placement if required - damage to eyes, teeth, lips or other oral mucosa - nerve damage due to positioning  - sore throat or hoarseness - Damage to heart, brain, nerves, lungs, other parts of body or loss of life  Patient voiced understanding.)        Anesthesia Quick Evaluation

## 2021-08-04 NOTE — Op Note (Signed)
[facility] Patient Name: Stefanie [name] Procedure Date: 08/04/2021 9:44 AM MRN: [id number] Account #: [id number] Date of Birth: [birth year] Admit Type: Outpatient Age: 46 Room: [facility] ENDO ROOM 4 Gender: Female Note Status: Finalized Instrument Name: Peds Colonoscope [id number] Procedure:             Colonoscopy Indications:           Family history of colon cancer in a first-degree                         relative before age 79 years, Last colonoscopy 5 years                         ago Providers:             Lin Landsman MD, MD Referring MD:          Bethena Roys. Sowles, MD (Referring MD) Medicines:             General Anesthesia Complications:         No immediate complications. Estimated blood loss: None. Procedure:             Pre-Anesthesia Assessment:                        - Prior to the procedure, a History and Physical was                         performed, and patient medications and allergies were                         reviewed. The patient is competent. The risks and                         benefits of the procedure and the sedation options and                         risks were discussed with the patient. All questions                         were answered and informed consent was obtained.                         Patient identification and proposed procedure were                         verified by the physician, the nurse, the                         anesthesiologist, the anesthetist and the technician                         in the pre-procedure area in the procedure room in the                         endoscopy suite. Mental Status Examination: alert and                         oriented. Airway Examination: normal oropharyngeal  airway and neck mobility. Respiratory Examination:                         clear to auscultation. CV Examination: normal.                         Prophylactic Antibiotics: The  patient does not require                         prophylactic antibiotics. Prior Anticoagulants: The                         patient has taken no previous anticoagulant or                         antiplatelet agents. ASA Grade Assessment: III - A                         patient with severe systemic disease. After reviewing                         the risks and benefits, the patient was deemed in                         satisfactory condition to undergo the procedure. The                         anesthesia plan was to use general anesthesia.                         Immediately prior to administration of medications,                         the patient was re-assessed for adequacy to receive                         sedatives. The heart rate, respiratory rate, oxygen                         saturations, blood pressure, adequacy of pulmonary                         ventilation, and response to care were monitored                         throughout the procedure. The physical status of the                         patient was re-assessed after the procedure.                        After obtaining informed consent, the colonoscope was                         passed under direct vision. Throughout the procedure,                         the patient's blood pressure, pulse, and oxygen  saturations were monitored continuously. The                         Colonoscope was introduced through the anus and                         advanced to the the terminal ileum, with                         identification of the appendiceal orifice and IC                         valve. The colonoscopy was performed without                         difficulty. The patient tolerated the procedure well.                         The quality of the bowel preparation was evaluated                         using the BBPS [facility] Bowel Preparation Scale) with                         scores of: Right Colon =  3, Transverse Colon = 3 and                         Left Colon = 3 (entire mucosa seen well with no                         residual staining, small fragments of stool or opaque                         liquid). The total BBPS score equals 9. Findings:      The perianal and digital rectal examinations were normal. Pertinent       negatives include normal sphincter tone and no palpable rectal lesions.      The terminal ileum appeared normal.      The entire examined colon appeared normal.      The retroflexed view of the distal rectum and anal verge was normal and       showed no anal or rectal abnormalities. Impression:            - The examined portion of the ileum was normal.                        - The entire examined colon is normal.                        - The distal rectum and anal verge are normal on                         retroflexion view.                        - No specimens collected. Recommendation:        - Discharge patient to home (with escort).                        -  Resume previous diet today.                        - Continue present medications.                        - Repeat colonoscopy in 5 years for screening purposes. Procedure Code(s):     --- Professional ---                        [phone], Colonoscopy, flexible; diagnostic, including                         collection of specimen(s) by brushing or washing, when                         performed (separate procedure) Diagnosis Code(s):     --- Professional ---                        Z80.0, Family history of malignant neoplasm of                         digestive organs CPT copyright 2019 American Medical Association. All rights reserved. The codes documented in this report are preliminary and upon coder review may  be revised to meet current compliance requirements. Dr. Ulyess Mort Lin Landsman MD, MD 08/04/2021 10:15:45 AM This report has been signed electronically. Number of Addenda: 0 Note  Initiated On: 08/04/2021 9:44 AM Scope Withdrawal Time: 0 hours 9 minutes 25 seconds  Total Procedure Duration: 0 hours 15 minutes 0 seconds  Estimated Blood Loss:  Estimated blood loss: none.      [facility]

## 2021-08-05 ENCOUNTER — Encounter: Payer: Self-pay | Admitting: Gastroenterology

## 2021-09-07 ENCOUNTER — Encounter: Payer: Self-pay | Admitting: Physician Assistant

## 2021-09-07 ENCOUNTER — Ambulatory Visit: Payer: 59 | Admitting: Physician Assistant

## 2021-09-07 VITALS — BP 112/64 | HR 82 | Temp 98.1°F | Resp 16 | Ht 70.0 in | Wt 159.9 lb

## 2021-09-07 DIAGNOSIS — R197 Diarrhea, unspecified: Secondary | ICD-10-CM | POA: Diagnosis not present

## 2021-09-07 DIAGNOSIS — E039 Hypothyroidism, unspecified: Secondary | ICD-10-CM

## 2021-09-07 NOTE — Progress Notes (Signed)
Acute Office Visit  Subjective:    Patient ID: Stefanie [name], female    DOB: 1975/08/15, 46 y.o.   MRN: [id number]  Today's Provider: Talitha Givens, MHS, PA-C Introduced myself to the patient as a PA-C and provided education on APPs in clinical practice.    Chief Complaint  Patient presents with   Nausea    Pt has been feeling nauseous, poor appetite, cramps and noticed loose stools for at least 3 weeks. It has not got better since it started.    HPI Patient is in today for abdominal cramps, loose stools, nausea for the past 3 weeks States she has noticed mild correlations with certain foods and symptoms but is not certain States she went out Saturday night and ate out, Sunday she reports her symptoms were very problematic Alleviating: states she does better when she doesn't eat Has been trying to stay hydrated and drink plenty of fluids States her symptoms appear to be reduced when she eats fruits and vegetables States she is having a bowel movement about 3-4 times per day with stool character similar to 5 and 7 on [place] stool chart States before loose stools she was experiencing chronic constipation   States she did not have any issues after her colonoscopy    States she is having issues with her nails splitting and being more fragile States she is having canker sores on her gums more frequently  She is having generalized muscle aches and tenderness   Past Medical History:  Diagnosis Date   Benign neoplasm of thyroid gland    Breast cyst 2014   Iron deficiency anemia    Osteopenia    Spontaneous dissection of coronary artery    Thyroid disease    hypothyroidism   Vitamin D deficiency 03/25/2018    Past Surgical History:  Procedure Laterality Date   BREAST SURGERY     left aspiration    CESAREAN SECTION  07/20/2016   COLONOSCOPY WITH PROPOFOL N/A 08/04/2021   Procedure: COLONOSCOPY WITH PROPOFOL;  Surgeon: Lin Landsman, MD;  Location: [facility]  ENDOSCOPY;  Service: Gastroenterology;  Laterality: N/A;   LEFT HEART CATH AND CORONARY ANGIOGRAPHY N/A 02/09/2020   Procedure: LEFT HEART CATH AND CORONARY ANGIOGRAPHY;  Surgeon: Belva Crome, MD;  Location: [place] [facility];  Service: Cardiovascular;  Laterality: N/A;    Family History  Problem Relation Age of Onset   Cancer - Colon Mother 22   Hyperlipidemia Mother    Hypertension Mother    Diabetes Mellitus II Father    CVA Father 79   Cancer Maternal Grandmother 14   Alzheimer's disease Paternal Grandmother     Social History   Socioeconomic History   Marital status: Married    Spouse name: Jacqualyn Sedgwick   Number of children: 1   Years of education: 26   Highest education level: Associate degree: academic program  Occupational History    Comment: prn for health unit coordinator   Tobacco Use   Smoking status: Never   Smokeless tobacco: Never  Vaping Use   Vaping Use: Never used  Substance and Sexual Activity   Alcohol use: No   Drug use: No   Sexual activity: Yes    Partners: Male    Birth control/protection: None  Other Topics Concern   Not on file  Social History Narrative   Married since 2015, they have one child born 07/20/2016   Works part time at [employer]  Financial Resource Strain: Low Risk    Difficulty of Paying Living Expenses: Not hard at all  Food Insecurity: No Food Insecurity   Worried About Charity fundraiser in the Last Year: Never true   Ran Out of Food in the Last Year: Never true  Transportation Needs: No Transportation Needs   Lack of Transportation (Medical): No   Lack of Transportation (Non-Medical): No  Physical Activity: Sufficiently Active   Days of Exercise per Week: 3 days   Minutes of Exercise per Session: 80 min  Stress: No Stress Concern Present   Feeling of Stress : Not at all  Social Connections: Socially Integrated   Frequency of Communication with Friends  and Family: More than three times a week   Frequency of Social Gatherings with Friends and Family: Twice a week   Attends Religious Services: More than 4 times per year   Active Member of [employer] or Organizations: Yes   Attends Music therapist: More than 4 times per year   Marital Status: Married  Human resources officer Violence: Not At Risk   Fear of Current or Ex-Partner: No   Emotionally Abused: No   Physically Abused: No   Sexually Abused: No    Outpatient Medications Prior to Visit  Medication Sig Dispense Refill   aspirin 81 MG chewable tablet Chew 1 tablet (81 mg total) by mouth daily.     atorvastatin (LIPITOR) 10 MG tablet Take 0.5 tablets (5 mg total) by mouth daily. 45 tablet 3   levothyroxine (SYNTHROID, LEVOTHROID) 100 MCG tablet Take 100 mcg by mouth daily before breakfast.      metoprolol succinate (TOPROL-XL) 25 MG 24 hr tablet TAKE 1 TABLET BY MOUTH EVERY DAY 30 tablet 5   Multiple Vitamin (MULTIVITAMIN ADULT PO) Take 1 tablet by mouth daily.     nitroGLYCERIN (NITROSTAT) 0.4 MG SL tablet Place 1 tablet (0.4 mg total) under the tongue every 5 (five) minutes x 3 doses as needed for chest pain. 25 tablet 0   Vitamin D, Ergocalciferol, (DRISDOL) 50000 units CAPS capsule Take 50,000 Units by mouth once a week. Wednesdays  1   benzonatate (TESSALON) 100 MG capsule Take 2 capsules (200 mg total) by mouth 2 (two) times daily as needed for cough. (Patient not taking: Reported on 09/07/2021) 20 capsule 0   ferrous sulfate 325 (65 FE) MG EC tablet Take 1 tablet (325 mg total) by mouth 3 (three) times daily with meals. (Patient not taking: Reported on 09/07/2021) 90 tablet 5   No facility-administered medications prior to visit.    Allergies  Allergen Reactions   Fosamax [Alendronate] Other (See Comments)    stomachache   Latex Itching   Reclast [Zoledronic Acid] Rash    Review of Systems  Constitutional:  Positive for fatigue. Negative for chills,  diaphoresis and fever.  Gastrointestinal:  Positive for abdominal distention, diarrhea and nausea. Negative for anal bleeding, blood in stool and vomiting.  Musculoskeletal:  Positive for myalgias.      Objective:    Physical Exam HENT:     Mouth/Throat:     Lips: Pink.     Mouth: Mucous membranes are moist. No injury or oral lesions.     Dentition: Normal dentition. Does not have dentures. No gum lesions.     Tongue: No lesions. Tongue does not deviate from midline.     Pharynx: Oropharynx is clear. Uvula midline. No oropharyngeal exudate or posterior oropharyngeal erythema.     Comments: Mildly pale gums  noted  Cardiovascular:     Rate and Rhythm: Normal rate and regular rhythm.     Pulses: Normal pulses.     Heart sounds: Normal heart sounds.  Pulmonary:     Effort: Pulmonary effort is normal.     Breath sounds: Normal breath sounds and air entry.  Abdominal:     General: Abdomen is flat. Bowel sounds are normal.     Palpations: Abdomen is soft.     Tenderness: There is no abdominal tenderness.     Comments: Dullness to percussion over left quadrants- nontender no masses appreciated   Musculoskeletal:     Right lower leg: No edema.     Left lower leg: No edema.    BP 112/64    Pulse 82    Temp 98.1 F (36.7 C) (Oral)    Resp 16    Ht 5\' 10"  (1.778 m)    Wt 159 lb 14.4 oz (72.5 kg)    SpO2 99%    BMI 22.94 kg/m  Wt Readings from Last 3 Encounters:  09/07/21 159 lb 14.4 oz (72.5 kg)  08/04/21 159 lb (72.1 kg)  06/15/21 166 lb (75.3 kg)    Health Maintenance Due  Topic Date Due   COVID-19 Vaccine (3 - Pfizer risk series) 12/11/2019    There are no preventive care reminders to display for this patient.   Lab Results  Component Value Date   TSH 0.466 02/09/2020   Lab Results  Component Value Date   WBC 4.3 06/09/2020   HGB 11.2 (L) 06/09/2020   HCT 35.0 06/09/2020   MCV 89.3 06/09/2020   PLT 296 06/09/2020   Lab Results  Component Value Date   NA 139  06/09/2020   K 3.8 06/09/2020   CO2 31 06/09/2020   GLUCOSE 82 06/09/2020   BUN 7 06/09/2020   CREATININE 0.62 06/09/2020   BILITOT 0.5 06/09/2020   AST 15 06/09/2020   ALT 11 06/09/2020   PROT 7.0 06/09/2020   CALCIUM 9.9 06/09/2020   ANIONGAP 8 03/22/2020   Lab Results  Component Value Date   CHOL 107 06/09/2020   Lab Results  Component Value Date   HDL 48 (L) 06/09/2020   Lab Results  Component Value Date   LDLCALC 47 06/09/2020   Lab Results  Component Value Date   TRIG 42 06/09/2020   Lab Results  Component Value Date   CHOLHDL 2.2 06/09/2020   Lab Results  Component Value Date   HGBA1C 5.6 02/09/2020       Assessment & Plan:   Problem List Items Addressed This Visit       Endocrine   Hypothyroidism Chronic, historic concern Patient states this is well managed by Endocrinology and last TSH was in Nov - normal per patient With concerns for diarrhea, cramping and nail changes would like to rule out thyroid as possible etiology     Relevant Orders   TSH   Other Visit Diagnoses     Diarrhea, unspecified type    -  Primary Acute, new concern, unmanaged, appears stable  Suspicious of overflow diarrhea from chronic constipation based on patient HPI Recommend keeping food and symptom diary to discern triggers and correlations Recommend using Miralax to resolve any remaining stool burden and encourage regular bowel movements Recommend increasing daily fiber content to assist with improving stool passage Follow up in approx 4 weeks if symptoms are not resolving with these strategies - may need allergy work up for food sensitivity  Relevant Orders   Comprehensive Metabolic Panel (CMET)   CBC w/Diff/Platelet        No follow-ups on file.   I, Detta Mellin E Madell Heino, PA-C, have reviewed all documentation for this visit. The documentation on 09/07/21 for the exam, diagnosis, procedures, and orders are all accurate and complete.   Savannaha Stonerock, [place]  [facility]   No orders of the defined types were placed in this encounter.    Tala Eber E Colman Bird[place], PA-C

## 2021-09-07 NOTE — Patient Instructions (Addendum)
I recommend that you keep a food and symptoms diary for the next few weeks This will help Stefanie [name] to determine if there are any correlations or triggers from your diet   I would like you to take Miralax for the next few days to see if we can relieve the cramping and nausea as I believe you may have an unresolved stool burden that is causing overflow diarrhea.  Keep taking the Miralax for maintenance once you feel you are having regular bowel movements - reduce to a half tablespoon for maintenance for a week  Increase fiber intake by about 5 grams per week until you are at 20 -30 grams per day   If your symptoms are not improving or become worse in the next 4 weeks please let Stefanie [name] know.   It was nice to meet you and I appreciate the opportunity to be involved in your care

## 2021-09-08 LAB — CBC WITH DIFFERENTIAL/PLATELET
Absolute Monocytes: 423 cells/uL (ref 200–950)
Basophils Absolute: 10 cells/uL (ref 0–200)
Basophils Relative: 0.2 %
Eosinophils Absolute: 102 cells/uL (ref 15–500)
Eosinophils Relative: 2 %
HCT: 35.3 % (ref 35.0–45.0)
Hemoglobin: 11.4 g/dL — ABNORMAL LOW (ref 11.7–15.5)
Lymphs Abs: 1244 cells/uL (ref 850–3900)
MCH: 29.2 pg (ref 27.0–33.0)
MCHC: 32.3 g/dL (ref 32.0–36.0)
MCV: 90.5 fL (ref 80.0–100.0)
MPV: 11.6 fL (ref 7.5–12.5)
Monocytes Relative: 8.3 %
Neutro Abs: 3320 cells/uL (ref 1500–7800)
Neutrophils Relative %: 65.1 %
Platelets: 302 10*3/uL (ref 140–400)
RBC: 3.9 10*6/uL (ref 3.80–5.10)
RDW: 11.8 % (ref 11.0–15.0)
Total Lymphocyte: 24.4 %
WBC: 5.1 10*3/uL (ref 3.8–10.8)

## 2021-09-08 LAB — COMPREHENSIVE METABOLIC PANEL
AG Ratio: 1.6 (calc) (ref 1.0–2.5)
ALT: 10 U/L (ref 6–29)
AST: 15 U/L (ref 10–35)
Albumin: 4.2 g/dL (ref 3.6–5.1)
Alkaline phosphatase (APISO): 64 U/L (ref 31–125)
BUN: 7 mg/dL (ref 7–25)
CO2: 31 mmol/L (ref 20–32)
Calcium: 9.7 mg/dL (ref 8.6–10.2)
Chloride: 102 mmol/L (ref 98–110)
Creat: 0.57 mg/dL (ref 0.50–0.99)
Globulin: 2.7 g/dL (calc) (ref 1.9–3.7)
Glucose, Bld: 86 mg/dL (ref 65–99)
Potassium: 4.2 mmol/L (ref 3.5–5.3)
Sodium: 138 mmol/L (ref 135–146)
Total Bilirubin: 0.5 mg/dL (ref 0.2–1.2)
Total Protein: 6.9 g/dL (ref 6.1–8.1)

## 2021-09-08 LAB — TSH: TSH: 0.86 mIU/L

## 2021-09-13 ENCOUNTER — Other Ambulatory Visit: Payer: Self-pay | Admitting: *Deleted

## 2021-09-13 MED ORDER — METOPROLOL SUCCINATE ER 25 MG PO TB24: 25.0000 mg | ORAL_TABLET | Freq: Every day | ORAL | 3 refills | Status: DC

## 2021-09-13 MED ORDER — ATORVASTATIN CALCIUM 10 MG PO TABS: 5.0000 mg | ORAL_TABLET | Freq: Every day | ORAL | 3 refills | Status: DC

## 2021-12-02 ENCOUNTER — Other Ambulatory Visit: Payer: Self-pay

## 2021-12-02 ENCOUNTER — Encounter: Payer: Self-pay | Admitting: Intensive Care

## 2021-12-02 ENCOUNTER — Emergency Department
Admission: EM | Admit: 2021-12-02 | Discharge: 2021-12-02 | Disposition: A | Discharge status: Home / Self Care | Payer: 59 | Attending: Emergency Medicine | Admitting: Emergency Medicine

## 2021-12-02 DIAGNOSIS — M5416 Radiculopathy, lumbar region: Secondary | ICD-10-CM

## 2021-12-02 DIAGNOSIS — E039 Hypothyroidism, unspecified: Secondary | ICD-10-CM | POA: Insufficient documentation

## 2021-12-02 DIAGNOSIS — D649 Anemia, unspecified: Secondary | ICD-10-CM | POA: Insufficient documentation

## 2021-12-02 MED ORDER — CYCLOBENZAPRINE HCL 5 MG PO TABS: 5.0000 mg | ORAL_TABLET | Freq: Three times a day (TID) | ORAL | 0 refills | Status: DC | PRN

## 2021-12-02 MED ORDER — LIDOCAINE 5 % EX PTCH
1.0000 | MEDICATED_PATCH | Freq: Once | CUTANEOUS | Status: DC
  Administered 2021-12-02: 1 via TRANSDERMAL
  Filled 2021-12-02: qty 1

## 2021-12-02 MED ORDER — KETOROLAC TROMETHAMINE 30 MG/ML IJ SOLN
30.0000 mg | Freq: Once | INTRAMUSCULAR | Status: AC
  Administered 2021-12-02: 30 mg via INTRAMUSCULAR
  Filled 2021-12-02: qty 1

## 2021-12-02 MED ORDER — LIDOCAINE 5 % EX PTCH: 1.0000 | MEDICATED_PATCH | Freq: Two times a day (BID) | CUTANEOUS | 0 refills | Status: DC

## 2021-12-02 NOTE — ED Notes (Signed)
Dc ppw provided. Pt denies VS at dc. Pt provides verbal consent for dc. Pt ambulatory off unit on foot alert and oriented. ?

## 2021-12-02 NOTE — ED Provider Notes (Signed)
? ?[facility] ?Provider Note ? ? ? Event Date/Time  ? First MD Initiated Contact with Patient 12/02/21 1056   ?  (approximate) ? ? ?History  ? ?Chief Complaint ?Sciatica ? ? ?HPI ? ?Stefanie [name] is a 46 y.o. female with past medical history of hypothyroidism and anemia who presents to the ED complaining of back pain.  Patient reports that she has been dealing with about 1 week of pain in the left lower part of her back and moving into her left buttock.  Pain is described as constant and sharp, not exacerbated or alleviated by anything.  When she woke up this morning she felt a numb and tingling sensation moving down the lateral portion of her left leg.  She denies any weakness in the leg and has not had any difficulty ambulating.  She has not had any saddle anesthesia and denies any urinary retention or incontinence.  She has not taken anything for her symptoms today and denies any recent trauma. ?  ? ? ?Physical Exam  ? ?Triage Vital Signs: ?ED Triage Vitals  ?Enc Vitals Group  ?   BP 12/02/21 1029 122/62  ?   Pulse Rate 12/02/21 1029 64  ?   Resp 12/02/21 1029 18  ?   Temp 12/02/21 1029 98.4 ?F (36.9 ?C)  ?   Temp Source 12/02/21 1029 Oral  ?   SpO2 12/02/21 1029 100 %  ?   Weight 12/02/21 1029 159 lb (72.1 kg)  ?   Height 12/02/21 1029 '5\' 10"'$  (1.778 m)  ?   Head Circumference --   ?   Peak Flow --   ?   Pain Score 12/02/21 1032 8  ?   Pain Loc --   ?   Pain Edu? --   ?   Excl. in [place]? --   ? ? ?Most recent vital signs: ?Vitals:  ? 12/02/21 1029  ?BP: 122/62  ?Pulse: 64  ?Resp: 18  ?Temp: 98.4 ?F (36.9 ?C)  ?SpO2: 100%  ? ? ?Constitutional: Alert and oriented. ?Eyes: Conjunctivae are normal. ?Head: Atraumatic. ?Nose: No congestion/rhinnorhea. ?Mouth/Throat: Mucous membranes are moist.  ?Cardiovascular: Normal rate, regular rhythm. Grossly normal heart sounds.  2+ radial and DP pulses bilaterally. ?Respiratory: Normal respiratory effort.  No retractions. Lungs CTAB. ?Gastrointestinal: Soft  and nontender. No distention. ?Musculoskeletal: No lower extremity tenderness nor edema.  No thoracic or lumbar midline spinal tenderness to palpation. ?Neurologic:  Normal speech and language. No gross focal neurologic deficits are appreciated. ? ? ? ?ED Results / Procedures / Treatments  ? ?Labs ?(all labs ordered are listed, but only abnormal results are displayed) ?Labs Reviewed - No data to display ? ? ?PROCEDURES: ? ?Critical Care performed: No ? ?Procedures ? ? ?MEDICATIONS ORDERED IN ED: ?Medications  ?ketorolac (TORADOL) 30 MG/ML injection 30 mg (has no administration in time range)  ?lidocaine (LIDODERM) 5 % 1 patch (has no administration in time range)  ? ? ? ?IMPRESSION / MDM / ASSESSMENT AND PLAN / ED COURSE  ?I reviewed the triage vital signs and the nursing notes. ?             ?               ? ?46 y.o. female with past medical history of hypothyroidism and anemia presents to the ED complaining of 1 week of left lower back pain radiating into her buttock now with some numbness and tingling moving down into her left leg. ? ?  Differential diagnosis includes, but is not limited to, arterial insufficiency, venous insufficiency, lumbar radiculopathy, cauda equina, DVT. ? ?Patient well-appearing and in no acute distress, vital signs are unremarkable and she is neurovascular intact to her left lower extremity.  Strength is intact throughout her lower extremities and no findings to suggest cauda equina.  She has strong pulses and no findings to suggest DVT or infectious process.  Symptoms consistent with lumbar radiculopathy and she is appropriate for conservative outpatient management.  We will give dose of IM Toradol and Lidoderm patch here in the ED, prescription provided for Lidoderm as well as muscle relaxant.  She was counseled to follow-up with her PCP and to return to the ED for new worsening symptoms, patient agrees with plan. ? ?  ? ? ?FINAL CLINICAL IMPRESSION(S) / ED DIAGNOSES  ? ?Final diagnoses:   ?Lumbar radiculopathy  ? ? ? ?Rx / DC Orders  ? ?ED Discharge Orders   ? ?      Ordered  ?  lidocaine (LIDODERM) 5 %  Every 12 hours       ? 12/02/21 1142  ?  cyclobenzaprine (FLEXERIL) 5 MG tablet  3 times daily PRN       ? 12/02/21 1142  ? ?  ?  ? ?  ? ? ? ?Note:  This document was prepared using Dragon voice recognition software and may include unintentional dictation errors. ?  ?Blake Divine, MD ?12/02/21 1145 ? ?

## 2021-12-02 NOTE — ED Triage Notes (Signed)
Patient c/o left lower back/buttocks pain that started Monday. Reports numbness this AM that radiates down leg to foot ?

## 2021-12-02 NOTE — ED Notes (Signed)
See triage note  presents with some pain to lower back which is moving into left leg  states sxs' started couple of days ago  ?

## 2021-12-05 ENCOUNTER — Encounter: Payer: Self-pay | Admitting: Family Medicine

## 2021-12-05 ENCOUNTER — Ambulatory Visit (INDEPENDENT_AMBULATORY_CARE_PROVIDER_SITE_OTHER): Payer: No Typology Code available for payment source | Admitting: Family Medicine

## 2021-12-05 VITALS — BP 126/74 | HR 81 | Resp 16 | Ht 70.0 in | Wt 163.0 lb

## 2021-12-05 DIAGNOSIS — M5432 Sciatica, left side: Secondary | ICD-10-CM | POA: Diagnosis not present

## 2021-12-05 MED ORDER — PREGABALIN 50 MG PO CAPS: 50.0000 mg | ORAL_CAPSULE | Freq: Three times a day (TID) | ORAL | 0 refills | Status: DC

## 2021-12-05 MED ORDER — PREDNISONE 10 MG (48) PO TBPK: ORAL_TABLET | Freq: Every day | ORAL | 0 refills | Status: DC

## 2021-12-05 NOTE — Patient Instructions (Addendum)
Prednisone - you can split dose into twice daily but take it with food and no later than 4 pm ? ?Lyrica ( pregablin) start with one tablet at night , may increase to up to three at night, after two days you can try one in am and two at night, and increase to up to 150 mg three times daily. It may make you sleepy, so the first few days don't take it if you need to drive  ?

## 2021-12-05 NOTE — Progress Notes (Signed)
Name: Stefanie [name]   MRN: [id number]    DOB: Dec 16, 1975   Date:12/05/2021 ? ?     Progress Note ? ?Subjective ? ?Chief Complaint ? ?Sciatica  ? ?HPI ? 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 She states on her buttocks it feels swollen/like she is sitting on a ball. She denies bowel or bladder incontinence, no weakness but discomfort when walking and moving. She went to [facility] on 05/05 and was given ketarolac and sent home on lidoderm patch and flexeril .  ? ? ?Patient Active Problem List  ? Diagnosis Date Noted  ? Colon cancer screening   ? Abnormal uterine bleeding 01/26/2021  ? Humerus lesion, left 02/12/2020  ? Unstable angina ([place]) 02/09/2020  ? Spontaneous dissection of coronary artery   ? NSTEMI (non-ST elevated myocardial infarction) ([place])   ? History of cesarean delivery 09/23/2018  ? Breast lump in lower-outer quadrant 06/03/2018  ? Family history of colon cancer 06/03/2018  ? 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  ? Vitamin D deficiency 03/25/2018  ? Hypothyroidism 09/24/2017  ? Umbilical hernia 23/30/0762  ? History of osteoporosis 05/16/2017  ? Thyroid nodule 05/16/2017  ? Osteoporosis 05/16/2017  ? Uterine leiomyoma 03/07/2016  ? ? ?Past Surgical History:  ?Procedure Laterality Date  ? BREAST SURGERY    ? left aspiration   ? CESAREAN SECTION  07/20/2016  ? COLONOSCOPY WITH PROPOFOL N/A 08/04/2021  ? Procedure: COLONOSCOPY WITH PROPOFOL;  Surgeon: Lin Landsman, MD;  Location: [facility] ENDOSCOPY;  Service: Gastroenterology;  Laterality: N/A;  ? LEFT HEART CATH AND CORONARY ANGIOGRAPHY N/A 02/09/2020  ? Procedure: LEFT HEART CATH AND CORONARY ANGIOGRAPHY;  Surgeon: Belva Crome, MD;  Location: [place] [facility];  Service: Cardiovascular;  Laterality: N/A;  ? ? ?Family History  ?Problem Relation Age of Onset  ? Cancer - Colon Mother 71  ? Hyperlipidemia Mother   ? Hypertension Mother   ? Diabetes Mellitus II Father   ? CVA Father 48  ? Cancer Maternal Grandmother 32  ? Alzheimer's disease Paternal Grandmother   ? ? ?Social History  ? ?Tobacco Use  ? Smoking status: Never  ? Smokeless tobacco: Never  ?Substance Use Topics  ? Alcohol use: No  ? ? ? ?Current Outpatient Medications:  ?  aspirin 81 MG chewable tablet, Chew 1 tablet (81 mg total) by mouth daily., Disp: , Rfl:  ?  atorvastatin (LIPITOR) 10 MG tablet, Take 0.5 tablets (5 mg total) by mouth daily., Disp: 45 tablet, Rfl: 3 ?  cyclobenzaprine (FLEXERIL) 5 MG tablet, Take 1 tablet (5 mg total) by mouth 3 (three) times daily as needed., Disp: 12 tablet, Rfl: 0 ?  levothyroxine (SYNTHROID, LEVOTHROID) 100 MCG tablet, Take 100 mcg by mouth daily before breakfast. , Disp: , Rfl:  ?  lidocaine (LIDODERM) 5 %, Place 1 patch onto the skin every 12 (twelve) hours. Remove & Discard patch within 12 hours or as directed by MD, Disp: 10 patch, Rfl: 0 ?  metoprolol succinate (TOPROL-XL) 25 MG 24 hr tablet, Take 1 tablet (25 mg total) by mouth daily., Disp: 90 tablet, Rfl: 3 ?  Multiple Vitamin (MULTIVITAMIN ADULT PO), Take 1 tablet by mouth daily., Disp: , Rfl:  ?  nitroGLYCERIN (NITROSTAT) 0.4 MG SL tablet, Place 1 tablet (0.4 mg total) under the tongue every 5 (five) minutes x 3 doses as needed for chest pain., Disp: 25 tablet, Rfl: 0 ?  Vitamin D, Ergocalciferol, (DRISDOL) 50000 units CAPS capsule, Take 50,000 Units by mouth once a week. Wednesdays, Disp: , Rfl: 1 ? ?Allergies  ?Allergen Reactions  ? Fosamax [Alendronate] Other (See Comments)  ?  stomachache  ? Latex Itching  ? Reclast [Zoledronic Acid] Rash  ? ? ?I personally reviewed active problem list, medication list, allergies, family history, social history, health maintenance  with the patient/caregiver today. ? ? ?ROS ? ?Ten systems reviewed and is negative except as mentioned in HPI  ? ?Objective ? ?Vitals:  ? 12/05/21 1542  ?BP: 126/74  ?Pulse: 81  ?Resp: 16  ?SpO2: 99%  ?Weight: 163 lb (73.9 kg)  ?Height: '5\' 10"'$  (1.778 m)  ? ? ?Body mass index is 23.39 kg/m?. ? ?Physical Exam ? ?Constitutional: Patient appears well-developed and well-nourished.  No distress.  ?HEENT: head atraumatic, normocephalic, pupils equal and reactive to light, neck supple ?Cardiovascular: Normal rate, regular rhythm and normal heart sounds.  No murmur heard. No BLE edema. ?Pulmonary/Chest: Effort normal and breath sounds normal. No respiratory distress. ?Abdominal: Soft.  There is no tenderness. ?Psychiatric: Patient has a normal mood and affect. behavior is normal. Judgment and thought content normal.  ?Muscular Skeletal: pain during palpation of lumbar spine , no rashes, positive left straight leg raise, antalgic gait . Pain with rom of spine  ? ?Recent Results (from the past 2160 hour(s))  ?TSH     Status: None  ? Collection Time: 09/07/21 10:49 AM  ?Result Value Ref Range  ? TSH 0.86 mIU/L  ?  Comment:           Reference Range ?. ?          > or = 20 Years  0.40-4.50 ?. ?               Pregnancy Ranges ?          First trimester    0.26-2.66 ?          Second trimester   0.55-2.73 ?          Third trimester    0.43-2.91 ?  ?Comprehensive Metabolic Panel (CMET)     Status: None  ? Collection Time: 09/07/21 10:49 AM  ?Result Value Ref Range  ? Glucose, Bld 86 65 - 99 mg/dL  ?  Comment: . ?           Fasting reference interval ?. ?  ? BUN 7 7 - 25 mg/dL  ? Creat 0.57 0.50 - 0.99 mg/dL  ? BUN/Creatinine Ratio NOT APPLICABLE 6 - 22 (calc)  ? Sodium 138 135 - 146 mmol/L  ? Potassium 4.2 3.5 - 5.3 mmol/L  ? Chloride 102 98 - 110 mmol/L  ? CO2 31 20 - 32 mmol/L  ? Calcium 9.7 8.6 - 10.2 mg/dL  ? Total Protein 6.9 6.1 - 8.1 g/dL  ? Albumin 4.2 3.6 - 5.1 g/dL  ? Globulin 2.7 1.9 - 3.7 g/dL (calc)  ? AG Ratio 1.6 1.0  - 2.5 (calc)  ? Total Bilirubin 0.5 0.2 - 1.2 mg/dL  ? Alkaline phosphatase (APISO) 64 31 - 125 U/L  ? AST 15 10 - 35 U/L  ? ALT 10 6 -  29 U/L  ?CBC w/Diff/Platelet     Status: Abnormal  ? Collection Time: 09/07/21 10:49 AM  ?Result Value Ref Range  ? WBC 5.1 3.8 - 10.8 Thousand/uL  ? RBC 3.90 3.80 - 5.10 Million/uL  ? Hemoglobin 11.4 (L) 11.7 - 15.5 g/dL  ? HCT 35.3 35.0 - 45.0 %  ? MCV 90.5 80.0 - 100.0 fL  ? MCH 29.2 27.0 - 33.0 pg  ? MCHC 32.3 32.0 - 36.0 g/dL  ? RDW 11.8 11.0 - 15.0 %  ? Platelets 302 140 - 400 Thousand/uL  ? MPV 11.6 7.5 - 12.5 fL  ? Neutro Abs 3,320 1,500 - 7,800 cells/uL  ? Lymphs Abs 1,244 850 - 3,900 cells/uL  ? Absolute Monocytes 423 200 - 950 cells/uL  ? Eosinophils Absolute 102 15 - 500 cells/uL  ? Basophils Absolute 10 0 - 200 cells/uL  ? Neutrophils Relative % 65.1 %  ? Total Lymphocyte 24.4 %  ? Monocytes Relative 8.3 %  ? Eosinophils Relative 2.0 %  ? Basophils Relative 0.2 %  ? ? ?PHQ2/9: ? ?  12/05/2021  ?  3:42 PM 09/07/2021  ? 10:00 AM 06/21/2021  ?  9:59 AM 06/15/2021  ?  9:51 AM 06/09/2020  ?  1:44 PM  ?Depression screen PHQ 2/9  ?Decreased Interest 0 0 0 0 0  ?Down, Depressed, Hopeless 0 0 0 0 0  ?PHQ - 2 Score 0 0 0 0 0  ?Altered sleeping  0  1 0  ?Tired, decreased energy  0  0 0  ?Change in appetite  0  0 0  ?Feeling bad or failure about yourself   0  0 0  ?Trouble concentrating  0  0 0  ?Moving slowly or fidgety/restless  0  0 0  ?Suicidal thoughts  0  0 0  ?PHQ-9 Score  0  1 0  ?Difficult doing work/chores  Not difficult at all   Not difficult at all  ?  ?phq 9 is negative ? ? ?Fall Risk: ? ?  12/05/2021  ?  3:41 PM 09/07/2021  ? 10:00 AM 06/21/2021  ?  9:58 AM 06/15/2021  ?  9:51 AM 06/09/2020  ?  1:38 PM  ?Fall Risk   ?Falls in the past year? 0 0 0 0 0  ?Number falls in past yr: 0 0 0 0 0  ?Injury with Fall? 0 0 0 0 0  ?Risk for fall due to : No Fall Risks No Fall Risks  No Fall Risks   ?Follow up Falls prevention discussed Falls prevention discussed Falls evaluation completed  Falls prevention discussed   ? ? ? ? ?Functional Status Survey: ?Is the patient deaf or have difficulty hearing?: No ?Does the patient have difficulty seeing, even when wearing glasses/contacts?: No ?Does

## 2021-12-06 ENCOUNTER — Telehealth: Payer: Self-pay | Admitting: Family Medicine

## 2021-12-06 DIAGNOSIS — M5432 Sciatica, left side: Secondary | ICD-10-CM

## 2021-12-06 NOTE — Telephone Encounter (Signed)
Medication Refill - Medication:  ?pregabalin (LYRICA) 50 MG capsule  ? ?Has the patient contacted their pharmacy? Yes.   ?Contact PCP ?*Was sent to another pharmacy but cost to much for the pt, so she requested if it could be sent to [facility] instead* ? ?Preferred Pharmacy (with phone number or street name):  ?[facility] # [place], [place] Phone:  [phone]  ?Fax:  [phone]  ?  ? ?Has the patient been seen for an appointment in the last year OR does the patient have an upcoming appointment? Yes.   ? ?Agent: Please be advised that RX refills may take up to 3 business days. We ask that you follow-up with your pharmacy. ?

## 2021-12-07 ENCOUNTER — Other Ambulatory Visit: Payer: Self-pay | Admitting: Family Medicine

## 2021-12-07 DIAGNOSIS — M5432 Sciatica, left side: Secondary | ICD-10-CM

## 2021-12-07 MED ORDER — PREGABALIN 50 MG PO CAPS: 50.0000 mg | ORAL_CAPSULE | Freq: Three times a day (TID) | ORAL | 0 refills | Status: DC

## 2021-12-12 ENCOUNTER — Encounter: Payer: Self-pay | Admitting: Family Medicine

## 2021-12-13 ENCOUNTER — Other Ambulatory Visit: Payer: Self-pay | Admitting: Family Medicine

## 2021-12-13 ENCOUNTER — Other Ambulatory Visit: Payer: Self-pay

## 2021-12-13 DIAGNOSIS — M5432 Sciatica, left side: Secondary | ICD-10-CM

## 2021-12-13 MED ORDER — PREGABALIN 50 MG PO CAPS: 50.0000 mg | ORAL_CAPSULE | Freq: Three times a day (TID) | ORAL | 0 refills | Status: DC

## 2022-03-08 ENCOUNTER — Other Ambulatory Visit: Payer: Self-pay | Admitting: Cardiovascular Disease

## 2022-03-20 ENCOUNTER — Telehealth: Payer: Self-pay | Admitting: Cardiovascular Disease

## 2022-03-20 MED ORDER — ATORVASTATIN CALCIUM 10 MG PO TABS: 5.0000 mg | ORAL_TABLET | Freq: Every day | ORAL | 0 refills | Status: DC

## 2022-03-20 MED ORDER — METOPROLOL SUCCINATE ER 25 MG PO TB24: 25.0000 mg | ORAL_TABLET | Freq: Every day | ORAL | 0 refills | Status: DC

## 2022-03-20 NOTE — Telephone Encounter (Signed)
Sent refills in for 90 days, patient will get more refills at appointment in November.

## 2022-03-20 NOTE — Telephone Encounter (Signed)
*  STAT* If patient is at the pharmacy, call can be transferred to refill team.   1. Which medications need to be refilled? (please list name of each medication and dose if known)   metoprolol succinate (TOPROL-XL) 25 MG 24 hr tablet  atorvastatin (LIPITOR) 10 MG tablet  2. Which pharmacy/location (including street and city if local pharmacy) is medication to be sent to?  [id number] [place], [place] - [address]  3. Do they need a 30 day or 90 day supply? 949  Pt made a f/u 06/06/22 with Dr. NJohnsie Cancel

## 2022-05-29 NOTE — Progress Notes (Incomplete)
CARDIOLOGY OFFICE NOTE  Date:  05/29/2022    Stefanie [name] Date of Birth: 1975-09-06 Medical Record #[id number]  PCP:  Steele Sizer, MD  Cardiologist:  Johnsie Cancel  No chief complaint on file.   History of Present Illness: Stefanie [name] is a 46 y.o. 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  Otherwise had normal left main, LAD, LCX and RCA along with   normal LV function.  Plavix d/c last year On statin with LDL at goal per primary   Renal duplex 03/23/20 with 1-59% left stenosis Did not appear like FMD Carotid normal   Son Stefanie [name] is 5 she home schools and he was with her today  She works per diem 2/month at [employer] for a living   ***  Past Medical History:  Diagnosis Date   Benign neoplasm of thyroid gland    Breast cyst 2014   Iron deficiency anemia    Osteopenia    Spontaneous dissection of coronary artery    Thyroid disease    hypothyroidism   Vitamin D deficiency 03/25/2018    Past Surgical History:  Procedure Laterality Date   BREAST SURGERY     left aspiration    CESAREAN SECTION  07/20/2016   COLONOSCOPY WITH PROPOFOL N/A 08/04/2021   Procedure: COLONOSCOPY WITH PROPOFOL;  Surgeon: Lin Landsman, MD;  Location: [facility];  Service: Gastroenterology;  Laterality: N/A;   LEFT HEART CATH AND CORONARY ANGIOGRAPHY N/A 02/09/2020   Procedure: LEFT HEART CATH AND CORONARY ANGIOGRAPHY;  Surgeon: Belva Crome, MD;  Location: [facility];  Service: Cardiovascular;  Laterality: N/A;     Medications: No outpatient medications have been marked as taking for the 06/06/22 encounter (Appointment) with Josue Hector, MD.     Allergies: Allergies  Allergen Reactions   Fosamax [Alendronate] Other (See Comments)    stomachache   Latex Itching   Reclast  [Zoledronic Acid] Rash    Social History: The patient  reports that she has never smoked. She has never used smokeless tobacco. She reports that she does not drink alcohol and does not use drugs.   Family History: The patient's family history includes Alzheimer's disease in her paternal grandmother; CVA (age of onset: 81) in her father; Cancer (age of onset: 71) in her maternal grandmother; Cancer - Colon (age of onset: 27) in her mother; Diabetes Mellitus II in her father; Hyperlipidemia in her mother; Hypertension in her mother.   Review of Systems: Please see the history of present illness.   All other systems are reviewed and negative.   Physical Exam: VS:  There were no vitals taken for this visit. Marland Kitchen  BMI There is no height or weight on file to calculate BMI.  Wt Readings from Last 3 Encounters:  12/05/21 163 lb (73.9 kg)  12/02/21 159 lb (72.1 kg)  09/07/21 159 lb 14.4 oz (72.5 kg)    Affect appropriate Healthy:  appears stated age 49: normal Neck supple with no adenopathy JVP normal no bruits no thyromegaly Lungs clear with no wheezing and good diaphragmatic motion Heart:  S1/S2 no murmur, no rub, gallop or click PMI normal Abdomen: benighn, BS positve, no tenderness, no AAA no bruit.  No HSM or HJR Distal pulses intact with no bruits No edema Neuro non-focal Skin warm and dry No muscular weakness  LABORATORY DATA:  EKG:  03/23/20  NSR normal no acute changes 05/29/2022 SR rate 67 normal    Lab Results  Component Value Date   WBC 5.1 09/07/2021   HGB 11.4 (L) 09/07/2021   HCT 35.3 09/07/2021   PLT 302 09/07/2021   GLUCOSE 86 09/07/2021   CHOL 107 06/09/2020   TRIG 42 06/09/2020   HDL 48 (L) 06/09/2020   LDLCALC 47 06/09/2020   ALT 10 09/07/2021   AST 15 09/07/2021   NA 138 09/07/2021   K 4.2 09/07/2021   CL 102 09/07/2021   CREATININE 0.57 09/07/2021   BUN 7 09/07/2021   CO2 31 09/07/2021   TSH 0.86 09/07/2021   HGBA1C 5.6 02/09/2020      BNP (last 3 results) No results for input(s): "BNP" in the last 8760 hours.  ProBNP (last 3 results) No results for input(s): "PROBNP" in the last 8760 hours.   Other Studies Reviewed Today:  Vascuscreen 05/29/22  Summary:  Right Carotid: Normal: little or no evidence of plaque visualized in the  right internal carotid artery.  Left Carotid: Normal: little or no evidence of plaque visualized in the  left internal carotid artery.     Renal:     Right: Normal size right kidney. Normal right Resisitive Index.         Normal cortical thickness of right kidney. No evidence of         right renal artery stenosis. RRV flow present.  Left:  No evidence of left renal artery stenosis. LRV flow present.         Normal size of left kidney. Normal left Resistive Index.         Normal cortical thickness of the left kidney. 1-59% stenosis         of the left renal artery.  Mesenteric:  Normal Celiac artery and Superior Mesenteric artery findings.     No obvious FMD by color Doppler.     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  Patient is asymptomatic at the time of cath. Normal left main Normal LAD Otherwise normal circumflex Dominant normal right coronary artery Normal LV systolic function and end-diastolic pressure.   RECOMMENDATIONS:   Beta-blocker therapy and blood pressure control to less than 120/80 mmHg.  Renin angiotensin blockade or calcium channel blocker therapy would be considerations in addition to beta-blocker if needed. Aspirin and Plavix for least 6 months. Statin therapy Avoid full anticoagulation. Consider screening carotid and renal arteries for evidence of fibromuscular dysplasia We will check hs-CRP and sed rate.    ECHO IMPRESSIONS 01/2020   1. Left ventricular ejection  fraction, by estimation, is 55 to 60%. The  left ventricle has normal function. The left ventricle has no regional  wall motion abnormalities. Left ventricular diastolic parameters were  normal.   2. Right ventricular systolic function is normal. The right ventricular  size is normal.   3. The mitral valve is normal in structure. Trivial mitral valve  regurgitation. No evidence of mitral stenosis.   4. The aortic valve is tricuspid. Aortic valve regurgitation is not  visualized. No aortic stenosis is present.   5. The inferior vena cava is normal in size with greater than 50%  respiratory variability, suggesting right atrial pressure of 3 mmHg.      ASSESSMENT & PLAN:  1.Coronary Disection: -July 2021 stable with no recurrence . D/c plavix   2. HLD: - on statin - LDL 47 06/09/20   3. Fatigue: - this has improved with the change to long acting Toprol - we will continue.   4. Mild RAS: - normal creatinine - no FMD noted - update [place]  5. Thyroid:  On synthroid replacement TSH normal   Current medicines are reviewed with the patient today.  The patient does not have concerns regarding medicines other than what has been noted above.  The following changes have been made:  See above.  Labs/ tests ordered today include:   Renal Duplex   No orders of the defined types were placed in this encounter.    Disposition:   FU with me in a year      Patient is agreeable to this plan and will call if any problems develop in the interim.   Signed: Jenkins Rouge, MD  05/29/2022 3:04 PM  [facility] [address] [place], [place]  [address] Phone: [phone] Fax: [phone]

## 2022-06-06 ENCOUNTER — Ambulatory Visit: Payer: Medicaid Other | Admitting: Cardiovascular Disease

## 2022-06-13 ENCOUNTER — Other Ambulatory Visit: Payer: Self-pay | Admitting: Cardiovascular Disease

## 2022-06-15 NOTE — Patient Instructions (Incomplete)
Preventive Care 40-48 Years Old, Female Preventive care refers to lifestyle choices and visits with your health care provider that can promote health and wellness. Preventive care visits are also called wellness exams. What can I expect for my preventive care visit? Counseling Your health care provider may ask you questions about your: Medical history, including: Past medical problems. Family medical history. Pregnancy history. Current health, including: Menstrual cycle. Method of birth control. Emotional well-being. Home life and relationship well-being. Sexual activity and sexual health. Lifestyle, including: Alcohol, nicotine or tobacco, and drug use. Access to firearms. Diet, exercise, and sleep habits. Work and work environment. Sunscreen use. Safety issues such as seatbelt and bike helmet use. Physical exam Your health care provider will check your: Height and weight. These may be used to calculate your BMI (body mass index). BMI is a measurement that tells if you are at a healthy weight. Waist circumference. This measures the distance around your waistline. This measurement also tells if you are at a healthy weight and may help predict your risk of certain diseases, such as type 2 diabetes and high blood pressure. Heart rate and blood pressure. Body temperature. Skin for abnormal spots. What immunizations do I need?  Vaccines are usually given at various ages, according to a schedule. Your health care provider will recommend vaccines for you based on your age, medical history, and lifestyle or other factors, such as travel or where you work. What tests do I need? Screening Your health care provider may recommend screening tests for certain conditions. This may include: Lipid and cholesterol levels. Diabetes screening. This is done by checking your blood sugar (glucose) after you have not eaten for a while (fasting). Pelvic exam and Pap test. Hepatitis B test. Hepatitis C  test. HIV (human immunodeficiency virus) test. STI (sexually transmitted infection) testing, if you are at risk. Lung cancer screening. Colorectal cancer screening. Mammogram. Talk with your health care provider about when you should start having regular mammograms. This may depend on whether you have a family history of breast cancer. BRCA-related cancer screening. This may be done if you have a family history of breast, ovarian, tubal, or peritoneal cancers. Bone density scan. This is done to screen for osteoporosis. Talk with your health care provider about your test results, treatment options, and if necessary, the need for more tests. Follow these instructions at home: Eating and drinking  Eat a diet that includes fresh fruits and vegetables, whole grains, lean protein, and low-fat dairy products. Take vitamin and mineral supplements as recommended by your health care provider. Do not drink alcohol if: Your health care provider tells you not to drink. You are pregnant, may be pregnant, or are planning to become pregnant. If you drink alcohol: Limit how much you have to 0-1 drink a day. Know how much alcohol is in your drink. In the [place] one drink equals one 12 oz bottle of beer (355 mL), one 5 oz glass of wine (148 mL), or one 1 oz glass of hard liquor (44 mL). Lifestyle Brush your teeth every morning and night with fluoride toothpaste. Floss one time each day. Exercise for at least 30 minutes 5 or more days each week. Do not use any products that contain nicotine or tobacco. These products include cigarettes, chewing tobacco, and vaping devices, such as e-cigarettes. If you need help quitting, ask your health care provider. Do not use drugs. If you are sexually active, practice safe sex. Use a condom or other form of protection to   prevent STIs. If you do not wish to become pregnant, use a form of birth control. If you plan to become pregnant, see your health care provider for a  prepregnancy visit. Take aspirin only as told by your health care provider. Make sure that you understand how much to take and what form to take. Work with your health care provider to find out whether it is safe and beneficial for you to take aspirin daily. Find healthy ways to manage stress, such as: Meditation, yoga, or listening to music. Journaling. Talking to a trusted person. Spending time with friends and family. Minimize exposure to UV radiation to reduce your risk of skin cancer. Safety Always wear your seat belt while driving or riding in a vehicle. Do not drive: If you have been drinking alcohol. Do not ride with someone who has been drinking. When you are tired or distracted. While texting. If you have been using any mind-altering substances or drugs. Wear a helmet and other protective equipment during sports activities. If you have firearms in your house, make sure you follow all gun safety procedures. Seek help if you have been physically or sexually abused. What's next? Visit your health care provider once a year for an annual wellness visit. Ask your health care provider how often you should have your eyes and teeth checked. Stay up to date on all vaccines. This information is not intended to replace advice given to you by your health care provider. Make sure you discuss any questions you have with your health care provider. Document Revised: 01/12/2021 Document Reviewed: 01/12/2021 Elsevier Patient Education  [place].

## 2022-06-15 NOTE — Progress Notes (Deleted)
Name: Stefanie [name]   MRN: [id number]    DOB: 21-Apr-1976   Date:06/15/2022       Progress Note  Subjective  Chief Complaint  Annual Exam  HPI  Patient presents for annual CPE.  Diet: *** Exercise: ***  Last Eye Exam: *** Last Dental Exam: ***  Flowsheet Row Video Visit from [facility]/21/2021 in [facility]  AUDIT-C Score 0      Depression: Phq 9 is  {Desc; negative/positive:13464}    12/05/2021    3:42 PM 09/07/2021   10:00 AM 06/21/2021    9:59 AM 06/15/2021    9:51 AM 06/09/2020    1:44 PM  Depression screen PHQ 2/9  Decreased Interest 0 0 0 0 0  Down, Depressed, Hopeless 0 0 0 0 0  PHQ - 2 Score 0 0 0 0 0  Altered sleeping  0  1 0  Tired, decreased energy  0  0 0  Change in appetite  0  0 0  Feeling bad or failure about yourself   0  0 0  Trouble concentrating  0  0 0  Moving slowly or fidgety/restless  0  0 0  Suicidal thoughts  0  0 0  PHQ-9 Score  0  1 0  Difficult doing work/chores  Not difficult at all   Not difficult at all   Hypertension: BP Readings from Last 3 Encounters:  12/05/21 126/74  12/02/21 122/62  09/07/21 112/64   Obesity: Wt Readings from Last 3 Encounters:  12/05/21 163 lb (73.9 kg)  12/02/21 159 lb (72.1 kg)  09/07/21 159 lb 14.4 oz (72.5 kg)   BMI Readings from Last 3 Encounters:  12/05/21 23.39 kg/m  12/02/21 22.81 kg/m  09/07/21 22.94 kg/m     Vaccines:   HPV: N/A Tdap: up to date Shingrix: N/A Pneumonia: N/A Flu: due COVID-19: up to date   Hep C Screening: 06/09/20 STD testing and prevention (HIV/chl/gon/syphilis): 06/09/20 Intimate partner violence: negative screen  Sexual History : Menstrual History/LMP/Abnormal Bleeding:  Discussed importance of follow up if any post-menopausal bleeding: {Response; yes/no/na:63}  Incontinence Symptoms: negative for symptoms   Breast cancer:  - Last Mammogram: 07/26/21 - BRCA gene screening: N/A  Osteoporosis Prevention : Discussed high calcium and vitamin  D supplementation, weight bearing exercises Bone density: 06/15/20, ordered   Cervical cancer screening: 06/15/21  Skin cancer: Discussed monitoring for atypical lesions  Colorectal cancer: 08/04/21   Lung cancer:  Low Dose CT Chest recommended if Age 109-80 years, 27 pack-year currently smoking OR have quit w/in 15years. Patient does not qualify for screen   ECG: 11/15/20  Advanced Care Planning: A voluntary discussion about advance care planning including the explanation and discussion of advance directives.  Discussed health care proxy and Living will, and the patient was able to identify a health care proxy as ***.  Patient does not have a living will and power of attorney of health care   Lipids: Lab Results  Component Value Date   CHOL 107 06/09/2020   CHOL 155 02/10/2020   CHOL 150 05/22/2019   Lab Results  Component Value Date   HDL 48 (L) 06/09/2020   HDL 51 02/10/2020   HDL 50 05/22/2019   Lab Results  Component Value Date   LDLCALC 47 06/09/2020   LDLCALC 96 02/10/2020   LDLCALC 89 05/22/2019   Lab Results  Component Value Date   TRIG 42 06/09/2020   TRIG 42 02/10/2020   TRIG 49 05/22/2019  Lab Results  Component Value Date   CHOLHDL 2.2 06/09/2020   CHOLHDL 3.0 02/10/2020   CHOLHDL 3.4 06/03/2018   No results found for: "LDLDIRECT"  Glucose: Glucose, Bld  Date Value Ref Range Status  09/07/2021 86 65 - 99 mg/dL Final    Comment:    .            Fasting reference interval .   06/09/2020 82 65 - 99 mg/dL Final    Comment:    .            Fasting reference interval .   03/22/2020 102 (H) 70 - 99 mg/dL Final    Comment:    Glucose reference range applies only to samples taken after fasting for at least 8 hours.    Patient Active Problem List   Diagnosis Date Noted   Abnormal uterine bleeding 01/26/2021   Humerus lesion, left 02/12/2020   Unstable angina ([place]) 02/09/2020   Spontaneous dissection of coronary artery    history of nstemi     History of cesarean delivery 09/23/2018   Breast lump in lower-outer quadrant 06/03/2018   Family history of colon cancer 06/03/2018   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   Vitamin D deficiency 03/25/2018   Hypothyroidism 01/75/1025   Umbilical hernia 85/27/7824   History of osteoporosis 05/16/2017   Thyroid nodule 05/16/2017   Osteoporosis 05/16/2017   Uterine leiomyoma 03/07/2016    Past Surgical History:  Procedure Laterality Date   BREAST SURGERY     left aspiration    CESAREAN SECTION  07/20/2016   COLONOSCOPY WITH PROPOFOL N/A 08/04/2021   Procedure: COLONOSCOPY WITH PROPOFOL;  Surgeon: Lin Landsman, MD;  Location: [facility];  Service: Gastroenterology;  Laterality: N/A;   LEFT HEART CATH AND CORONARY ANGIOGRAPHY N/A 02/09/2020   Procedure: LEFT HEART CATH AND CORONARY ANGIOGRAPHY;  Surgeon: Belva Crome, MD;  Location: [facility];  Service: Cardiovascular;  Laterality: N/A;    Family History  Problem Relation Age of Onset   Cancer - Colon Mother 66   Hyperlipidemia Mother    Hypertension Mother    Diabetes Mellitus II Father    CVA Father 11   Cancer Maternal Grandmother 38   Alzheimer's disease Paternal Grandmother     Social History   Socioeconomic History   Marital status: Married    Spouse name: Katilynn Sinkler   Number of children: 1   Years of education: 14   Highest education level: Associate degree: academic program  Occupational History    Comment: prn for health unit coordinator   Tobacco Use   Smoking status: Never   Smokeless tobacco: Never  Vaping Use   Vaping Use: Never used  Substance and Sexual Activity   Alcohol use: No   Drug use: No   Sexual activity: Yes    Partners: Male    Birth control/protection: None  Other Topics Concern   Not on file  Social History Narrative   Married since 2015, they have one child born 07/20/2016   Works part time at [employer]  Strain: [place]  (06/15/2021)   Overall Financial Resource Strain (CARDIA)    Difficulty of Paying Living Expenses: Not hard at all  Food Insecurity: No [place] (06/15/2021)   Hunger Vital Sign    Worried About Running Out of Food in the Last Year: Never true    [place] in the  Last Year: Never true  Transportation Needs: No Transportation Needs (06/15/2021)   PRAPARE - Hydrologist (Medical): No    Lack of Transportation (Non-Medical): No  Physical Activity: Sufficiently Active (06/15/2021)   Exercise Vital Sign    Days of Exercise per Week: 3 days    Minutes of Exercise per Session: 80 min  Stress: No Stress Concern Present (06/15/2021)   Frank    Feeling of Stress : Not at all  Social Connections: [place] (06/15/2021)   Social Connection and Isolation Panel [NHANES]    Frequency of Communication with Friends and Family: More than three times a week    Frequency of Social Gatherings with Friends and Family: Twice a week    Attends Religious Services: More than 4 times per year    Active Member of [employer] or Organizations: Yes    Attends Music therapist: More than 4 times per year    Marital Status: Married  Human resources officer Violence: Not At Risk (06/15/2021)   Humiliation, Afraid, Rape, and Kick questionnaire    Fear of Current or Ex-Partner: No    Emotionally Abused: No    Physically Abused: No    Sexually Abused: No     Current Outpatient Medications:    aspirin 81 MG chewable tablet, Chew 1 tablet (81 mg total) by mouth daily., Disp: , Rfl:    atorvastatin (LIPITOR) 10 MG tablet, TAKE 1/2 TABLET BY MOUTH DAILY, Disp: 45 tablet, Rfl: 1   cyclobenzaprine (FLEXERIL) 5 MG tablet, Take 1 tablet (5 mg total) by mouth 3 (three) times daily as needed., Disp: 12 tablet, Rfl: 0   levothyroxine (SYNTHROID, LEVOTHROID) 100 MCG tablet, Take 100 mcg  by mouth daily before breakfast. , Disp: , Rfl:    lidocaine (LIDODERM) 5 %, Place 1 patch onto the skin every 12 (twelve) hours. Remove & Discard patch within 12 hours or as directed by MD, Disp: 10 patch, Rfl: 0   metoprolol succinate (TOPROL-XL) 25 MG 24 hr tablet, TAKE 1 TABLET (25 MG TOTAL) BY MOUTH DAILY., Disp: 90 tablet, Rfl: 1   Multiple Vitamin (MULTIVITAMIN ADULT PO), Take 1 tablet by mouth daily., Disp: , Rfl:    nitroGLYCERIN (NITROSTAT) 0.4 MG SL tablet, Place 1 tablet (0.4 mg total) under the tongue every 5 (five) minutes x 3 doses as needed for chest pain., Disp: 25 tablet, Rfl: 0   predniSONE (STERAPRED UNI-PAK 48 TAB) 10 MG (48) TBPK tablet, Take by mouth daily. Take as directed, Disp: 48 tablet, Rfl: 0   pregabalin (LYRICA) 50 MG capsule, Take 1-3 capsules (50-150 mg total) by mouth 3 (three) times daily., Disp: 90 capsule, Rfl: 0   Vitamin D, Ergocalciferol, (DRISDOL) 50000 units CAPS capsule, Take 50,000 Units by mouth once a week. Wednesdays, Disp: , Rfl: 1  Allergies  Allergen Reactions   Fosamax [Alendronate] Other (See Comments)    stomachache   Latex Itching   Reclast [Zoledronic Acid] Rash     ROS  ***  Objective  There were no vitals filed for this visit.  There is no height or weight on file to calculate BMI.  Physical Exam ***  No results found for this or any previous visit (from the past 2160 hour(s)).   Fall Risk:    12/05/2021    3:41 PM 09/07/2021   10:00 AM 06/21/2021    9:58 AM 06/15/2021    9:51 AM 06/09/2020  1:38 PM  [place] in the past year? 0 0 0 0 0  Number falls in past yr: 0 0 0 0 0  Injury with Fall? 0 0 0 0 0  Risk for fall due to : No Fall Risks No Fall Risks  No Fall Risks   Follow up Falls prevention discussed Falls prevention discussed Falls evaluation completed Falls prevention discussed      Functional Status Survey:     Assessment & Plan  1. Well adult exam ***   -USPSTF grade A and B  recommendations reviewed with patient; age-appropriate recommendations, preventive care, screening tests, etc discussed and encouraged; healthy living encouraged; see AVS for patient education given to patient -Discussed importance of 150 minutes of physical activity weekly, eat two servings of fish weekly, eat one serving of tree nuts ( cashews, pistachios, pecans, almonds.Marland Kitchen) every other day, eat 6 servings of fruit/vegetables daily and drink plenty of water and avoid sweet beverages.   -Reviewed Health Maintenance: Yes.

## 2022-06-16 ENCOUNTER — Encounter: Payer: 59 | Admitting: Family Medicine

## 2022-06-16 DIAGNOSIS — Z0000 Encounter for general adult medical examination without abnormal findings: Secondary | ICD-10-CM

## 2022-06-16 DIAGNOSIS — Z13820 Encounter for screening for osteoporosis: Secondary | ICD-10-CM

## 2022-06-30 DIAGNOSIS — Z419 Encounter for procedure for purposes other than remedying health state, unspecified: Secondary | ICD-10-CM | POA: Diagnosis not present

## 2022-07-31 DIAGNOSIS — Z419 Encounter for procedure for purposes other than remedying health state, unspecified: Secondary | ICD-10-CM | POA: Diagnosis not present

## 2022-08-03 DIAGNOSIS — D34 Benign neoplasm of thyroid gland: Secondary | ICD-10-CM | POA: Diagnosis not present

## 2022-08-03 DIAGNOSIS — E890 Postprocedural hypothyroidism: Secondary | ICD-10-CM | POA: Diagnosis not present

## 2022-08-03 DIAGNOSIS — E559 Vitamin D deficiency, unspecified: Secondary | ICD-10-CM | POA: Diagnosis not present

## 2022-08-03 DIAGNOSIS — E0511 Thyrotoxicosis with toxic single thyroid nodule with thyrotoxic crisis or storm: Secondary | ICD-10-CM | POA: Diagnosis not present

## 2022-08-03 DIAGNOSIS — M810 Age-related osteoporosis without current pathological fracture: Secondary | ICD-10-CM | POA: Diagnosis not present

## 2022-08-03 LAB — BASIC METABOLIC PANEL
BUN: 5 (ref 4–21)
CO2: 25 — AB (ref 13–22)
Chloride: 102 (ref 99–108)
Creatinine: 0.7 (ref 0.5–1.1)
Glucose: 82
Potassium: 4.6 mEq/L (ref 3.5–5.1)
Sodium: 139 (ref 137–147)

## 2022-08-03 LAB — TSH: TSH: 4.61 (ref 0.41–5.90)

## 2022-08-03 LAB — HEPATIC FUNCTION PANEL
ALT: 7 U/L (ref 7–35)
AST: 13 (ref 13–35)
Alkaline Phosphatase: 63 (ref 25–125)
Bilirubin, Total: 0.4

## 2022-08-03 LAB — COMPREHENSIVE METABOLIC PANEL
Albumin: 3.9 (ref 3.5–5.0)
Calcium: 9.5 (ref 8.7–10.7)
Globulin: 2.6
eGFR: 100

## 2022-08-03 LAB — LIPID PANEL
Cholesterol: 119 (ref 0–200)
HDL: 54 (ref 35–70)
LDL Cholesterol: 55
LDl/HDL Ratio: 1
Triglycerides: 41 (ref 40–160)

## 2022-08-03 LAB — VITAMIN D 25 HYDROXY (VIT D DEFICIENCY, FRACTURES): Vit D, 25-Hydroxy: 57.1

## 2022-08-11 DIAGNOSIS — E890 Postprocedural hypothyroidism: Secondary | ICD-10-CM | POA: Diagnosis not present

## 2022-08-11 DIAGNOSIS — D34 Benign neoplasm of thyroid gland: Secondary | ICD-10-CM | POA: Diagnosis not present

## 2022-08-11 DIAGNOSIS — E559 Vitamin D deficiency, unspecified: Secondary | ICD-10-CM | POA: Diagnosis not present

## 2022-08-11 DIAGNOSIS — M810 Age-related osteoporosis without current pathological fracture: Secondary | ICD-10-CM | POA: Diagnosis not present

## 2022-08-11 DIAGNOSIS — E0511 Thyrotoxicosis with toxic single thyroid nodule with thyrotoxic crisis or storm: Secondary | ICD-10-CM | POA: Diagnosis not present

## 2022-08-15 NOTE — Progress Notes (Signed)
CARDIOLOGY OFFICE NOTE  Date:  08/28/2022    Stefanie [name] Date of Birth: [birth year] Medical Record #[id number]  PCP:  Steele Sizer, MD  Cardiologist:  Johnsie Cancel  No chief complaint on file.   History of Present Illness: Stefanie [name] is a 47 y.o. 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  Otherwise had normal left main, LAD, LCX and RCA along with   normal LV function.  It was recommended to be on beta-blocker therapy and blood pressure control to less than 120/80.  Plavix and aspirin added for at least 6 months along with statin therapy. She was to consider vascular screening as well for fibromuscular dysplasia. Sed rate and CRP were normal.   She was seen by Ermalinda Barrios, PA 02/24/20 . Very fatigued on metoprolol and this was changed to Toprol.   Back to the ER 03/22/20 with chest pain but left AMA.  HS troponin was negative x 2.   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 Rx steroid taper and Lyrica   Discussed d/c Toprol as her HR / BP are quite low     Past Medical History:  Diagnosis Date   Benign neoplasm of thyroid gland    Breast cyst 2014   Iron deficiency anemia    Osteopenia    Spontaneous dissection of coronary artery    Thyroid disease    hypothyroidism   Vitamin D deficiency 03/25/2018    Past Surgical History:  Procedure Laterality Date   BREAST SURGERY     left aspiration    CESAREAN SECTION  07/20/2016    COLONOSCOPY WITH PROPOFOL N/A 08/04/2021   Procedure: COLONOSCOPY WITH PROPOFOL;  Surgeon: Lin Landsman, MD;  Location: [facility];  Service: Gastroenterology;  Laterality: N/A;   LEFT HEART CATH AND CORONARY ANGIOGRAPHY N/A 02/09/2020   Procedure: LEFT HEART CATH AND CORONARY ANGIOGRAPHY;  Surgeon: Belva Crome, MD;  Location: [facility];  Service: Cardiovascular;  Laterality: N/A;     Medications: Current Meds  Medication Sig   aspirin 81 MG chewable tablet Chew 1 tablet (81 mg total) by mouth daily.   atorvastatin (LIPITOR) 10 MG tablet TAKE 1/2 TABLET BY MOUTH DAILY   levothyroxine (SYNTHROID, LEVOTHROID) 100 MCG tablet Take 100 mcg by mouth daily before breakfast.    metoprolol succinate (TOPROL-XL) 25 MG 24 hr tablet TAKE 1 TABLET (25 MG TOTAL) BY MOUTH DAILY.   Multiple Vitamin (MULTIVITAMIN ADULT PO) Take 1 tablet by mouth daily.   nitroGLYCERIN (NITROSTAT) 0.4 MG SL tablet Place 1 tablet (0.4 mg total) under the tongue every 5 (five) minutes x 3 doses as needed for chest pain.   Vitamin D, Ergocalciferol, (DRISDOL) 50000 units CAPS capsule Take 50,000 Units by mouth once a week. Wednesdays     Allergies: Allergies  Allergen Reactions   Fosamax [Alendronate] Other (See Comments)    stomachache   Latex Itching   Reclast [Zoledronic Acid]  Rash    Social History: The patient  reports that she has never smoked. She has never used smokeless tobacco. She reports that she does not drink alcohol and does not use drugs.   Family History: The patient's family history includes Alzheimer's disease in her paternal grandmother; CVA (age of onset: 5) in her father; Cancer (age of onset: 82) in her maternal grandmother; Cancer - Colon (age of onset: 25) in her mother; Diabetes Mellitus II in her father; Hyperlipidemia in her mother; Hypertension in her mother.   Review of Systems: Please see the history of present illness.   All other systems are reviewed and negative.    Physical Exam: VS:  BP 100/62   Pulse (!) 53   Ht '5\' 9"'$  (1.753 m)   Wt 161 lb 1.6 oz (73.1 kg)   LMP 08/17/2022 (Exact Date)   SpO2 99%   BMI 23.79 kg/m  .  BMI Body mass index is 23.79 kg/m.  Wt Readings from Last 3 Encounters:  08/28/22 161 lb 1.6 oz (73.1 kg)  08/17/22 162 lb (73.5 kg)  12/05/21 163 lb (73.9 kg)    Affect appropriate Healthy:  appears stated age 1: normal Neck supple with no adenopathy JVP normal no bruits no thyromegaly Lungs clear with no wheezing and good diaphragmatic motion Heart:  S1/S2 no murmur, no rub, gallop or click PMI normal Abdomen: benighn, BS positve, no tenderness, no AAA no bruit.  No HSM or HJR Distal pulses intact with no bruits No edema Neuro non-focal Skin warm and dry No muscular weakness    LABORATORY DATA:  EKG:  03/23/20  NSR normal no acute changes 08/28/2022 SR rate 67 normal  08/28/2022  SR rate 72 PVC otherwise normal QT 374 msec   Lab Results  Component Value Date   WBC 4.5 08/17/2022   HGB 11.7 08/17/2022   HCT 35.6 08/17/2022   PLT 221 08/17/2022   GLUCOSE 86 09/07/2021   CHOL 119 08/03/2022   TRIG 41 08/03/2022   HDL 54 08/03/2022   LDLCALC 55 08/03/2022   ALT 7 08/03/2022   AST 13 08/03/2022   NA 139 08/03/2022   K 4.6 08/03/2022   CL 102 08/03/2022   CREATININE 0.7 08/03/2022   BUN 5 08/03/2022   CO2 25 (A) 08/03/2022   TSH 4.61 08/03/2022   HGBA1C 5.8 (H) 08/17/2022     BNP (last 3 results) No results for input(s): "BNP" in the last 8760 hours.  ProBNP (last 3 results) No results for input(s): "PROBNP" in the last 8760 hours.   Other Studies Reviewed Today:  Vascuscreen 08/28/22  Summary:  Right Carotid: Normal: little or no evidence of plaque visualized in the  right internal carotid artery.  Left Carotid: Normal: little or no evidence of plaque visualized in the  left internal carotid artery.     Renal: 03/23/20     Right: Normal size right kidney. Normal right Resisitive  Index.         Normal cortical thickness of right kidney. No evidence of         right renal artery stenosis. RRV flow present.  Left:  No evidence of left renal artery stenosis. LRV flow present.         Normal size of left kidney. Normal left Resistive Index.         Normal cortical thickness of the left kidney. 1-59% stenosis         of the left renal artery.  Mesenteric:  Normal Celiac artery and Superior Mesenteric artery findings.     No obvious FMD by color Doppler.     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  Patient is asymptomatic at the time of cath. Normal left main Normal LAD Otherwise normal circumflex Dominant normal right coronary artery Normal LV systolic function and end-diastolic pressure.   RECOMMENDATIONS:   Beta-blocker therapy and blood pressure control to less than 120/80 mmHg.  Renin angiotensin blockade or calcium channel blocker therapy would be considerations in addition to beta-blocker if needed. Aspirin and Plavix for least 6 months. Statin therapy Avoid full anticoagulation. Consider screening carotid and renal arteries for evidence of fibromuscular dysplasia We will check hs-CRP and sed rate.    ECHO IMPRESSIONS 01/2020   1. Left ventricular ejection fraction, by estimation, is 55 to 60%. The  left ventricle has normal function. The left ventricle has no regional  wall motion abnormalities. Left ventricular diastolic parameters were  normal.   2. Right ventricular systolic function is normal. The right ventricular  size is normal.   3. The mitral valve is normal in structure. Trivial mitral valve  regurgitation. No evidence of mitral stenosis.   4. The aortic valve is tricuspid. Aortic valve regurgitation is not  visualized. No aortic stenosis is present.   5.  The inferior vena cava is normal in size with greater than 50%  respiratory variability, suggesting right atrial pressure of 3 mmHg.      ASSESSMENT & PLAN:     1.Coronary Disection: -July 2021 stable with no recurrence . D/c plavix D/C Toprol   2. HLD: - on statin - LDL 47 06/09/20   3. Fatigue: - this has improved with the change to long acting Toprol - we will continue.   4. Mild RAS: - normal creatinine - no FMD noted  [place] 03/23/20 - would follow and manage risk factors for CV disease. Update duplex   5. Thyroid:  On synthroid replacement TSH normal 09/07/21   Current medicines are reviewed with the patient today.  The patient does not have concerns regarding medicines other than what has been noted above.  The following changes have been made:  See above.  Labs/ tests ordered today include:    No orders of the defined types were placed in this encounter.  Renal Duplex  D/C Toprol   F/U in a year    Patient is agreeable to this plan and will call if any problems develop in the interim.   Signed: Jenkins Rouge, MD  08/28/2022 10:01 AM  [facility] [address] [place], [place]  [address] Phone: [phone] Fax: [phone]

## 2022-08-16 NOTE — Patient Instructions (Signed)
Preventive Care 40-49 Years Old, Female Preventive care refers to lifestyle choices and visits with your health care provider that can promote health and wellness. Preventive care visits are also called wellness exams. What can I expect for my preventive care visit? Counseling Your health care provider may ask you questions about your: Medical history, including: Past medical problems. Family medical history. Pregnancy history. Current health, including: Menstrual cycle. Method of birth control. Emotional well-being. Home life and relationship well-being. Sexual activity and sexual health. Lifestyle, including: Alcohol, nicotine or tobacco, and drug use. Access to firearms. Diet, exercise, and sleep habits. Work and work environment. Sunscreen use. Safety issues such as seatbelt and bike helmet use. Physical exam Your health care provider will check your: Height and weight. These may be used to calculate your BMI (body mass index). BMI is a measurement that tells if you are at a healthy weight. Waist circumference. This measures the distance around your waistline. This measurement also tells if you are at a healthy weight and may help predict your risk of certain diseases, such as type 2 diabetes and high blood pressure. Heart rate and blood pressure. Body temperature. Skin for abnormal spots. What immunizations do I need?  Vaccines are usually given at various ages, according to a schedule. Your health care provider will recommend vaccines for you based on your age, medical history, and lifestyle or other factors, such as travel or where you work. What tests do I need? Screening Your health care provider may recommend screening tests for certain conditions. This may include: Lipid and cholesterol levels. Diabetes screening. This is done by checking your blood sugar (glucose) after you have not eaten for a while (fasting). Pelvic exam and Pap test. Hepatitis B test. Hepatitis C  test. HIV (human immunodeficiency virus) test. STI (sexually transmitted infection) testing, if you are at risk. Lung cancer screening. Colorectal cancer screening. Mammogram. Talk with your health care provider about when you should start having regular mammograms. This may depend on whether you have a family history of breast cancer. BRCA-related cancer screening. This may be done if you have a family history of breast, ovarian, tubal, or peritoneal cancers. Bone density scan. This is done to screen for osteoporosis. Talk with your health care provider about your test results, treatment options, and if necessary, the need for more tests. Follow these instructions at home: Eating and drinking  Eat a diet that includes fresh fruits and vegetables, whole grains, lean protein, and low-fat dairy products. Take vitamin and mineral supplements as recommended by your health care provider. Do not drink alcohol if: Your health care provider tells you not to drink. You are pregnant, may be pregnant, or are planning to become pregnant. If you drink alcohol: Limit how much you have to 0-1 drink a day. Know how much alcohol is in your drink. In the [place] one drink equals one 12 oz bottle of beer (355 mL), one 5 oz glass of wine (148 mL), or one 1 oz glass of hard liquor (44 mL). Lifestyle Brush your teeth every morning and night with fluoride toothpaste. Floss one time each day. Exercise for at least 30 minutes 5 or more days each week. Do not use any products that contain nicotine or tobacco. These products include cigarettes, chewing tobacco, and vaping devices, such as e-cigarettes. If you need help quitting, ask your health care provider. Do not use drugs. If you are sexually active, practice safe sex. Use a condom or other form of protection to   prevent STIs. If you do not wish to become pregnant, use a form of birth control. If you plan to become pregnant, see your health care provider for a  prepregnancy visit. Take aspirin only as told by your health care provider. Make sure that you understand how much to take and what form to take. Work with your health care provider to find out whether it is safe and beneficial for you to take aspirin daily. Find healthy ways to manage stress, such as: Meditation, yoga, or listening to music. Journaling. Talking to a trusted person. Spending time with friends and family. Minimize exposure to UV radiation to reduce your risk of skin cancer. Safety Always wear your seat belt while driving or riding in a vehicle. Do not drive: If you have been drinking alcohol. Do not ride with someone who has been drinking. When you are tired or distracted. While texting. If you have been using any mind-altering substances or drugs. Wear a helmet and other protective equipment during sports activities. If you have firearms in your house, make sure you follow all gun safety procedures. Seek help if you have been physically or sexually abused. What's next? Visit your health care provider once a year for an annual wellness visit. Ask your health care provider how often you should have your eyes and teeth checked. Stay up to date on all vaccines. This information is not intended to replace advice given to you by your health care provider. Make sure you discuss any questions you have with your health care provider. Document Revised: 01/12/2021 Document Reviewed: 01/12/2021 Elsevier Patient Education  [place].

## 2022-08-16 NOTE — Progress Notes (Signed)
Name: Stefanie [name]   MRN: [id number]    DOB: [birth year]   Date:08/17/2022       Progress Note  Subjective  Chief Complaint  Annual Exam  HPI  Patient presents for annual CPE.  Diet: balanced diet  Exercise: she has been compliant with 30 minutes of physical activity every day   Last Eye Exam: up to date  Last Dental Exam: up to date   [employer] Visit from 08/17/2022 in [facility]  AUDIT-C Score 0      Depression: Phq 9 is  negative    08/17/2022    9:27 AM 12/05/2021    3:42 PM 09/07/2021   10:00 AM 06/21/2021    9:59 AM 06/15/2021    9:51 AM  Depression screen PHQ 2/9  Decreased Interest 0 0 0 0 0  Down, Depressed, Hopeless 0 0 0 0 0  PHQ - 2 Score 0 0 0 0 0  Altered sleeping 1  0  1  Tired, decreased energy 3  0  0  Change in appetite 0  0  0  Feeling bad or failure about yourself  0  0  0  Trouble concentrating 0  0  0  Moving slowly or fidgety/restless 0  0  0  Suicidal thoughts 0  0  0  PHQ-9 Score 4  0  1  Difficult doing work/chores   Not difficult at all     Hypertension: BP Readings from Last 3 Encounters:  08/17/22 116/68  12/05/21 126/74  12/02/21 122/62   Obesity: Wt Readings from Last 3 Encounters:  08/17/22 162 lb (73.5 kg)  12/05/21 163 lb (73.9 kg)  12/02/21 159 lb (72.1 kg)   BMI Readings from Last 3 Encounters:  08/17/22 23.92 kg/m  12/05/21 23.39 kg/m  12/02/21 22.81 kg/m     Vaccines:    Tdap: up to date Shingrix: N/A Pneumonia: N/A Flu: up to date COVID-19: up to date   Hep C Screening: 06/09/20 STD testing and prevention (HIV/chl/gon/syphilis): 06/09/20 Intimate partner violence: negative screen  Sexual History : no problems, same partner Menstrual History/LMP/Abnormal Bleeding: LMP today , cycles regular , heavy for two days and lasts 4 days  Discussed importance of follow up if any post-menopausal bleeding: N/A Incontinence Symptoms: negative for symptoms   Breast cancer:  - Last  Mammogram: 07/26/21, due- ordered today - BRCA gene screening: N/A  Osteoporosis Prevention : Discussed high calcium and vitamin D supplementation, weight bearing exercises Bone density: 06/15/20- done at Dr. Ronnald Collum   Cervical cancer screening: 06/15/21  Skin cancer: Discussed monitoring for atypical lesions  Colorectal cancer: 08/04/21   Lung cancer:  Low Dose CT Chest recommended if Age 72-80 years, 20 pack-year currently smoking OR have quit w/in 15years. Patient does not qualify for screen   ECG: 11/15/20  Advanced Care Planning: A voluntary discussion about advance care planning including the explanation and discussion of advance directives.  Discussed health care proxy and Living will, and the patient was able to identify a health care proxy as husband.  Patient does not have a living will and power of attorney of health care   Lipids: Lab Results  Component Value Date   CHOL 107 06/09/2020   CHOL 155 02/10/2020   CHOL 150 05/22/2019   Lab Results  Component Value Date   HDL 48 (L) 06/09/2020   HDL 51 02/10/2020   HDL 50 05/22/2019   Lab Results  Component Value Date   LDLCALC  47 06/09/2020   LDLCALC 96 02/10/2020   LDLCALC 89 05/22/2019   Lab Results  Component Value Date   TRIG 42 06/09/2020   TRIG 42 02/10/2020   TRIG 49 05/22/2019   Lab Results  Component Value Date   CHOLHDL 2.2 06/09/2020   CHOLHDL 3.0 02/10/2020   CHOLHDL 3.4 06/03/2018   No results found for: "LDLDIRECT"  Glucose: Glucose, Bld  Date Value Ref Range Status  09/07/2021 86 65 - 99 mg/dL Final    Comment:    .            Fasting reference interval .   06/09/2020 82 65 - 99 mg/dL Final    Comment:    .            Fasting reference interval .   03/22/2020 102 (H) 70 - 99 mg/dL Final    Comment:    Glucose reference range applies only to samples taken after fasting for at least 8 hours.    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   Vitamin D deficiency 03/25/2018   Hypothyroidism 81/82/9937   Umbilical hernia 16/96/7893   History of osteoporosis 05/16/2017   Thyroid nodule 05/16/2017   Osteoporosis 05/16/2017   Uterine leiomyoma 03/07/2016    Past Surgical History:  Procedure Laterality Date   BREAST SURGERY     left aspiration    CESAREAN SECTION  07/20/2016   COLONOSCOPY WITH PROPOFOL N/A 08/04/2021   Procedure: COLONOSCOPY WITH PROPOFOL;  Surgeon: Lin Landsman, MD;  Location: [facility];  Service: Gastroenterology;  Laterality: N/A;   LEFT HEART CATH AND CORONARY ANGIOGRAPHY N/A 02/09/2020   Procedure: LEFT HEART CATH AND CORONARY ANGIOGRAPHY;  Surgeon: Belva Crome, MD;  Location: [facility];  Service: Cardiovascular;  Laterality: N/A;    Family History  Problem Relation Age of Onset   Cancer - Colon Mother 74   Hyperlipidemia Mother    Hypertension Mother    Diabetes Mellitus II Father    CVA Father 18   Cancer Maternal Grandmother 39   Alzheimer's disease Paternal Grandmother     Social History   Socioeconomic History   Marital status: Married    Spouse name: Anastasia Tompson   Number of children: 1   Years of education: 14   Highest education level: Associate degree: academic program  Occupational History    Comment: prn for health unit coordinator   Tobacco Use   Smoking status: Never   Smokeless tobacco: Never  Vaping Use   Vaping Use: Never used  Substance and Sexual Activity   Alcohol use: No   Drug use: No   Sexual activity: Yes    Partners: Male    Birth control/protection: None  Other Topics Concern   Not on file  Social  History Narrative   Married since 2015, they have one child born 07/20/2016   Works part time at [employer] Strain: [place]  (08/17/2022)   Overall Financial Resource Strain (CARDIA)    Difficulty of Paying Living Expenses: Not hard at  all  Food Insecurity: No Food Insecurity (08/17/2022)   Hunger Vital Sign    Worried About Running Out of Food in the Last Year: Never true    Ran Out of Food in the Last Year: Never true  Transportation Needs: No Transportation Needs (08/17/2022)   PRAPARE - Hydrologist (Medical): No    Lack of Transportation (Non-Medical): No  Physical Activity: Sufficiently Active (08/17/2022)   Exercise Vital Sign    Days of Exercise per Week: 7 days    Minutes of Exercise per Session: 30 min  Stress: No Stress Concern Present (08/17/2022)   [place]    Feeling of Stress : Only a little  Social Connections: Socially Integrated (08/17/2022)   Social Connection and Isolation Panel [NHANES]    Frequency of Communication with Friends and Family: More than three times a week    Frequency of Social Gatherings with Friends and Family: Once a week    Attends Religious Services: More than 4 times per year    Active Member of [employer] or Organizations: Yes    Attends Music therapist: More than 4 times per year    Marital Status: Married  Human resources officer Violence: Not At Risk (08/17/2022)   Humiliation, Afraid, Rape, and Kick questionnaire    Fear of Current or Ex-Partner: No    Emotionally Abused: No    Physically Abused: No    Sexually Abused: No     Current Outpatient Medications:    aspirin 81 MG chewable tablet, Chew 1 tablet (81 mg total) by mouth daily., Disp: , Rfl:    atorvastatin (LIPITOR) 10 MG tablet, TAKE 1/2 TABLET BY MOUTH DAILY, Disp: 45 tablet, Rfl: 1   levothyroxine (SYNTHROID, LEVOTHROID) 100 MCG  tablet, Take 100 mcg by mouth daily before breakfast. , Disp: , Rfl:    metoprolol succinate (TOPROL-XL) 25 MG 24 hr tablet, TAKE 1 TABLET (25 MG TOTAL) BY MOUTH DAILY., Disp: 90 tablet, Rfl: 1   Multiple Vitamin (MULTIVITAMIN ADULT PO), Take 1 tablet by mouth daily., Disp: , Rfl:    nitroGLYCERIN (NITROSTAT) 0.4 MG SL tablet, Place 1 tablet (0.4 mg total) under the tongue every 5 (five) minutes x 3 doses as needed for chest pain., Disp: 25 tablet, Rfl: 0   Vitamin D, Ergocalciferol, (DRISDOL) 50000 units CAPS capsule, Take 50,000 Units by mouth once a week. Wednesdays, Disp: , Rfl: 1   cyclobenzaprine (FLEXERIL) 5 MG tablet, Take 1 tablet (5 mg total) by mouth 3 (three) times daily as needed. (Patient not taking: Reported on 08/17/2022), Disp: 12 tablet, Rfl: 0   lidocaine (LIDODERM) 5 %, Place 1 patch onto the skin every 12 (twelve) hours. Remove & Discard patch within 12 hours or as directed by MD (Patient not taking: Reported on 08/17/2022), Disp: 10 patch, Rfl: 0   predniSONE (STERAPRED UNI-PAK 48 TAB) 10 MG (48) TBPK tablet, Take by mouth daily. Take as directed (Patient not taking: Reported on 08/17/2022), Disp: 48 tablet, Rfl: 0   pregabalin (LYRICA) 50 MG capsule, Take 1-3 capsules (50-150 mg total) by mouth 3 (three) times daily. (Patient not taking: Reported on 08/17/2022), Disp: 90 capsule, Rfl: 0  Allergies  Allergen Reactions   Fosamax [Alendronate] Other (See Comments)    stomachache   Latex Itching   Reclast [Zoledronic Acid] Rash     ROS  Constitutional: Negative for fever or weight change.  Respiratory: Negative for cough and shortness  of breath.   Cardiovascular: Negative for chest pain or palpitations.  Gastrointestinal: Negative for abdominal pain, no bowel changes.  Musculoskeletal: Negative for gait problem or joint swelling.  Skin: Negative for rash.  Neurological: Negative for dizziness or headache. Intermittent numbness on index and 2nd finger right hand  No other  specific complaints in a complete review of systems (except as listed in HPI above).   Objective  Vitals:   08/17/22 0928  BP: 116/68  Pulse: 81  Resp: 16  SpO2: 99%  Weight: 162 lb (73.5 kg)  Height: '5\' 9"'$  (1.753 m)    Body mass index is 23.92 kg/m.  Physical Exam  Constitutional: Patient appears well-developed and well-nourished. No distress.  HENT: Head: Normocephalic and atraumatic. Ears: B TMs ok, no erythema or effusion; Nose: Nose normal. Mouth/Throat: Oropharynx is clear and moist. No oropharyngeal exudate.  Eyes: Conjunctivae and EOM are normal. Pupils are equal, round, and reactive to light. No scleral icterus.  Neck: Normal range of motion. Neck supple. No JVD present. No thyromegaly present.  Cardiovascular: Normal rate, regular rhythm and normal heart sounds.  No murmur heard. No BLE edema. Pulmonary/Chest: Effort normal and breath sounds normal. No respiratory distress. Abdominal: Soft. Bowel sounds are normal, no distension. There is no tenderness. no masses Breast: lumpy breast, round mass/ likely cyst at 7 o'clock right breast , no nipple discharge or rashes FEMALE GENITALIA:  Not done  RECTAL: not done  Musculoskeletal: Normal range of motion, no joint effusions. No gross deformities Neurological: he is alert and oriented to person, place, and time. No cranial nerve deficit. Coordination, balance, strength, speech and gait are normal.  Skin: Skin is warm and dry. No rash noted. No erythema.  Psychiatric: Patient has a normal mood and affect. behavior is normal. Judgment and thought content normal.    Fall Risk:    08/17/2022    9:27 AM 12/05/2021    3:41 PM 09/07/2021   10:00 AM 06/21/2021    9:58 AM 06/15/2021    9:51 AM  Fall Risk   Falls in the past year? 0 0 0 0 0  Number falls in past yr: 0 0 0 0 0  Injury with Fall? 0 0 0 0 0  Risk for fall due to : No Fall Risks No Fall Risks No Fall Risks  No Fall Risks  Follow up Falls prevention discussed Falls  prevention discussed Falls prevention discussed Falls evaluation completed Falls prevention discussed     Functional Status Survey: Is the patient deaf or have difficulty hearing?: No Does the patient have difficulty seeing, even when wearing glasses/contacts?: No Does the patient have difficulty concentrating, remembering, or making decisions?: No Does the patient have difficulty walking or climbing stairs?: No Does the patient have difficulty dressing or bathing?: No Does the patient have difficulty doing errands alone such as visiting a doctor's office or shopping?: No   Assessment & Plan  1. Well adult exam  - CBC with Differential/Platelet - Iron, TIBC and Ferritin Panel - Hemoglobin A1c  2. Breast cancer screening by mammogram  - MM 3D SCREEN BREAST BILATERAL; Future  3. History of anemia  - CBC with Differential/Platelet - Iron, TIBC and Ferritin Panel  4. Diabetes mellitus screening  - Hemoglobin A1c    5. Breast lump on right side at 7 o'clock position  - US BREAST LTD UNI RIGHT INC AXILLA; Future   -USPSTF grade A and B recommendations reviewed with patient; age-appropriate recommendations, preventive care, screening  tests, etc discussed and encouraged; healthy living encouraged; see AVS for patient education given to patient -Discussed importance of 150 minutes of physical activity weekly, eat two servings of fish weekly, eat one serving of tree nuts ( cashews, pistachios, pecans, almonds.Marland Kitchen) every other day, eat 6 servings of fruit/vegetables daily and drink plenty of water and avoid sweet beverages.   -Reviewed Health Maintenance: Yes.

## 2022-08-17 ENCOUNTER — Encounter: Payer: Self-pay | Admitting: Family Medicine

## 2022-08-17 ENCOUNTER — Ambulatory Visit (INDEPENDENT_AMBULATORY_CARE_PROVIDER_SITE_OTHER): Payer: Medicaid Other | Admitting: Family Medicine

## 2022-08-17 VITALS — BP 116/68 | HR 81 | Resp 16 | Ht 69.0 in | Wt 162.0 lb

## 2022-08-17 DIAGNOSIS — Z131 Encounter for screening for diabetes mellitus: Secondary | ICD-10-CM | POA: Diagnosis not present

## 2022-08-17 DIAGNOSIS — N6313 Unspecified lump in the right breast, lower outer quadrant: Secondary | ICD-10-CM | POA: Diagnosis not present

## 2022-08-17 DIAGNOSIS — Z0000 Encounter for general adult medical examination without abnormal findings: Secondary | ICD-10-CM

## 2022-08-17 DIAGNOSIS — Z862 Personal history of diseases of the blood and blood-forming organs and certain disorders involving the immune mechanism: Secondary | ICD-10-CM

## 2022-08-17 DIAGNOSIS — Z1231 Encounter for screening mammogram for malignant neoplasm of breast: Secondary | ICD-10-CM

## 2022-08-18 LAB — CBC WITH DIFFERENTIAL/PLATELET
Absolute Monocytes: 320 cells/uL (ref 200–950)
Basophils Absolute: 18 cells/uL (ref 0–200)
Basophils Relative: 0.4 %
Eosinophils Absolute: 32 cells/uL (ref 15–500)
Eosinophils Relative: 0.7 %
HCT: 35.6 % (ref 35.0–45.0)
Hemoglobin: 11.7 g/dL (ref 11.7–15.5)
Lymphs Abs: 1247 cells/uL (ref 850–3900)
MCH: 29.9 pg (ref 27.0–33.0)
MCHC: 32.9 g/dL (ref 32.0–36.0)
MCV: 91 fL (ref 80.0–100.0)
MPV: 12.5 fL (ref 7.5–12.5)
Monocytes Relative: 7.1 %
Neutro Abs: 2885 cells/uL (ref 1500–7800)
Neutrophils Relative %: 64.1 %
Platelets: 221 10*3/uL (ref 140–400)
RBC: 3.91 10*6/uL (ref 3.80–5.10)
RDW: 12.5 % (ref 11.0–15.0)
Total Lymphocyte: 27.7 %
WBC: 4.5 10*3/uL (ref 3.8–10.8)

## 2022-08-18 LAB — HEMOGLOBIN A1C
Hgb A1c MFr Bld: 5.8 % of total Hgb — ABNORMAL HIGH (ref <5.7)
Mean Plasma Glucose: 120 mg/dL
eAG (mmol/L): 6.6 mmol/L

## 2022-08-18 LAB — IRON,TIBC AND FERRITIN PANEL
%SAT: 17 % (calc) (ref 16–45)
Ferritin: 10 ng/mL — ABNORMAL LOW (ref 16–232)
Iron: 59 ug/dL (ref 40–190)
TIBC: 341 mcg/dL (calc) (ref 250–450)

## 2022-08-24 ENCOUNTER — Encounter: Payer: Self-pay | Admitting: Family Medicine

## 2022-08-28 ENCOUNTER — Ambulatory Visit: Payer: Medicaid Other | Attending: Cardiovascular Disease | Admitting: Cardiovascular Disease

## 2022-08-28 ENCOUNTER — Encounter: Payer: Self-pay | Admitting: Cardiovascular Disease

## 2022-08-28 VITALS — BP 100/62 | HR 53 | Ht 69.0 in | Wt 161.1 lb

## 2022-08-28 DIAGNOSIS — I2542 Coronary artery dissection: Secondary | ICD-10-CM | POA: Diagnosis not present

## 2022-08-28 DIAGNOSIS — I701 Atherosclerosis of renal artery: Secondary | ICD-10-CM

## 2022-08-28 DIAGNOSIS — E039 Hypothyroidism, unspecified: Secondary | ICD-10-CM | POA: Diagnosis not present

## 2022-08-28 NOTE — Patient Instructions (Signed)
Medication Instructions:  Your physician recommends that you continue on your current medications as directed. Please refer to the Current Medication list given to you today.  *If you need a refill on your cardiac medications before your next appointment, please call your pharmacy*  Lab Work: If you have labs (blood work) drawn today and your tests are completely normal, you will receive your results only by: [place] (if you have MyChart) OR A paper copy in the mail If you have any lab test that is abnormal or we need to change your treatment, we will call you to review the results.  Testing/Procedures: Your physician has requested that you have a renal artery duplex. During this test, an ultrasound is used to evaluate blood flow to the kidneys. Allow one hour for this exam. Do not eat after midnight the day before and avoid carbonated beverages. Take your medications as you usually do.   Follow-Up: At [facility], you and your health needs are our priority.  As part of our continuing mission to provide you with exceptional heart care, we have created designated Provider Care Teams.  These Care Teams include your primary Cardiologist (physician) and Advanced Practice Providers (APPs -  Physician Assistants and Nurse Practitioners) who all work together to provide you with the care you need, when you need it.  We recommend signing up for the patient portal called "MyChart".  Sign up information is provided on this After Visit Summary.  MyChart is used to connect with patients for Virtual Visits (Telemedicine).  Patients are able to view lab/test results, encounter notes, upcoming appointments, etc.  Non-urgent messages can be sent to your provider as well.   To learn more about what you can do with MyChart, go to [email or web address].    Your next appointment:   1 year(s)  Provider:   Jenkins Rouge, MD

## 2022-08-31 DIAGNOSIS — Z419 Encounter for procedure for purposes other than remedying health state, unspecified: Secondary | ICD-10-CM | POA: Diagnosis not present

## 2022-09-08 ENCOUNTER — Other Ambulatory Visit: Payer: Self-pay | Admitting: Family Medicine

## 2022-09-08 DIAGNOSIS — N6313 Unspecified lump in the right breast, lower outer quadrant: Secondary | ICD-10-CM

## 2022-09-13 ENCOUNTER — Other Ambulatory Visit: Payer: Self-pay | Admitting: *Deleted

## 2022-09-13 ENCOUNTER — Inpatient Hospital Stay
Admission: RE | Admit: 2022-09-13 | Discharge: 2022-09-13 | Disposition: A | Discharge status: Home / Self Care | Payer: Self-pay | Source: Ambulatory Visit | Attending: *Deleted | Admitting: *Deleted

## 2022-09-13 DIAGNOSIS — Z1231 Encounter for screening mammogram for malignant neoplasm of breast: Secondary | ICD-10-CM

## 2022-09-14 ENCOUNTER — Ambulatory Visit (HOSPITAL_COMMUNITY)
Admission: RE | Admit: 2022-09-14 | Discharge: 2022-09-14 | Disposition: A | Discharge status: Home / Self Care | Payer: Medicaid Other | Source: Ambulatory Visit | Attending: Cardiovascular Disease | Admitting: Cardiovascular Disease

## 2022-09-14 DIAGNOSIS — I701 Atherosclerosis of renal artery: Secondary | ICD-10-CM | POA: Insufficient documentation

## 2022-09-14 DIAGNOSIS — I2542 Coronary artery dissection: Secondary | ICD-10-CM | POA: Insufficient documentation

## 2022-09-18 ENCOUNTER — Inpatient Hospital Stay: Admission: RE | Admit: 2022-09-18 | Payer: Medicaid Other | Source: Ambulatory Visit

## 2022-09-29 DIAGNOSIS — Z419 Encounter for procedure for purposes other than remedying health state, unspecified: Secondary | ICD-10-CM | POA: Diagnosis not present

## 2022-10-03 ENCOUNTER — Ambulatory Visit
Admission: RE | Admit: 2022-10-03 | Discharge: 2022-10-03 | Disposition: A | Discharge status: Home / Self Care | Payer: Medicaid Other | Source: Ambulatory Visit | Attending: Family Medicine | Admitting: Family Medicine

## 2022-10-03 DIAGNOSIS — N6313 Unspecified lump in the right breast, lower outer quadrant: Secondary | ICD-10-CM

## 2022-10-03 DIAGNOSIS — N6011 Diffuse cystic mastopathy of right breast: Secondary | ICD-10-CM | POA: Diagnosis not present

## 2022-10-03 DIAGNOSIS — R922 Inconclusive mammogram: Secondary | ICD-10-CM | POA: Diagnosis not present

## 2022-10-16 DIAGNOSIS — D34 Benign neoplasm of thyroid gland: Secondary | ICD-10-CM | POA: Diagnosis not present

## 2022-10-16 DIAGNOSIS — M810 Age-related osteoporosis without current pathological fracture: Secondary | ICD-10-CM | POA: Diagnosis not present

## 2022-10-16 DIAGNOSIS — E559 Vitamin D deficiency, unspecified: Secondary | ICD-10-CM | POA: Diagnosis not present

## 2022-10-16 DIAGNOSIS — E890 Postprocedural hypothyroidism: Secondary | ICD-10-CM | POA: Diagnosis not present

## 2022-10-16 DIAGNOSIS — E0511 Thyrotoxicosis with toxic single thyroid nodule with thyrotoxic crisis or storm: Secondary | ICD-10-CM | POA: Diagnosis not present

## 2022-10-30 DIAGNOSIS — Z419 Encounter for procedure for purposes other than remedying health state, unspecified: Secondary | ICD-10-CM | POA: Diagnosis not present

## 2022-11-29 DIAGNOSIS — Z419 Encounter for procedure for purposes other than remedying health state, unspecified: Secondary | ICD-10-CM | POA: Diagnosis not present

## 2022-12-01 ENCOUNTER — Emergency Department (HOSPITAL_COMMUNITY)
Admission: EM | Admit: 2022-12-01 | Discharge: 2022-12-01 | Disposition: A | Discharge status: Home / Self Care | Payer: Medicaid Other | Attending: Emergency Medicine | Admitting: Emergency Medicine

## 2022-12-01 ENCOUNTER — Encounter (HOSPITAL_COMMUNITY): Payer: Self-pay | Admitting: Emergency Medicine

## 2022-12-01 ENCOUNTER — Emergency Department (HOSPITAL_COMMUNITY): Payer: Medicaid Other

## 2022-12-01 DIAGNOSIS — Z79890 Hormone replacement therapy (postmenopausal): Secondary | ICD-10-CM | POA: Diagnosis not present

## 2022-12-01 DIAGNOSIS — I2542 Coronary artery dissection: Secondary | ICD-10-CM | POA: Insufficient documentation

## 2022-12-01 DIAGNOSIS — Z91040 Latex allergy status: Secondary | ICD-10-CM | POA: Insufficient documentation

## 2022-12-01 DIAGNOSIS — Z7982 Long term (current) use of aspirin: Secondary | ICD-10-CM | POA: Insufficient documentation

## 2022-12-01 DIAGNOSIS — R079 Chest pain, unspecified: Secondary | ICD-10-CM | POA: Diagnosis not present

## 2022-12-01 DIAGNOSIS — E039 Hypothyroidism, unspecified: Secondary | ICD-10-CM | POA: Insufficient documentation

## 2022-12-01 DIAGNOSIS — R0789 Other chest pain: Secondary | ICD-10-CM | POA: Diagnosis not present

## 2022-12-01 LAB — COMPREHENSIVE METABOLIC PANEL
ALT: 15 U/L (ref 0–44)
AST: 18 U/L (ref 15–41)
Albumin: 3.6 g/dL (ref 3.5–5.0)
Alkaline Phosphatase: 49 U/L (ref 38–126)
Anion gap: 9 (ref 5–15)
BUN: 10 mg/dL (ref 6–20)
CO2: 24 mmol/L (ref 22–32)
Calcium: 9 mg/dL (ref 8.9–10.3)
Chloride: 105 mmol/L (ref 98–111)
Creatinine, Ser: 0.7 mg/dL (ref 0.44–1.00)
GFR, Estimated: >60 mL/min (ref >60)
Glucose, Bld: 105 mg/dL — ABNORMAL HIGH (ref 70–99)
Potassium: 3.7 mmol/L (ref 3.5–5.1)
Sodium: 138 mmol/L (ref 135–145)
Total Bilirubin: 0.7 mg/dL (ref 0.3–1.2)
Total Protein: 6.4 g/dL — ABNORMAL LOW (ref 6.5–8.1)

## 2022-12-01 LAB — TROPONIN I (HIGH SENSITIVITY)
Troponin I (High Sensitivity): 6 ng/L (ref <18)
Troponin I (High Sensitivity): 6 ng/L (ref <18)

## 2022-12-01 LAB — CBC WITH DIFFERENTIAL/PLATELET
Abs Immature Granulocytes: 0.01 10*3/uL (ref 0.00–0.07)
Basophils Absolute: 0 10*3/uL (ref 0.0–0.1)
Basophils Relative: 1 %
Eosinophils Absolute: 0.1 10*3/uL (ref 0.0–0.5)
Eosinophils Relative: 2 %
HCT: 35.3 % — ABNORMAL LOW (ref 36.0–46.0)
Hemoglobin: 11.8 g/dL — ABNORMAL LOW (ref 12.0–15.0)
Immature Granulocytes: 0 %
Lymphocytes Relative: 33 %
Lymphs Abs: 1.6 10*3/uL (ref 0.7–4.0)
MCH: 29.4 pg (ref 26.0–34.0)
MCHC: 33.4 g/dL (ref 30.0–36.0)
MCV: 88 fL (ref 80.0–100.0)
Monocytes Absolute: 0.5 10*3/uL (ref 0.1–1.0)
Monocytes Relative: 11 %
Neutro Abs: 2.6 10*3/uL (ref 1.7–7.7)
Neutrophils Relative %: 53 %
Platelets: 226 10*3/uL (ref 150–400)
RBC: 4.01 MIL/uL (ref 3.87–5.11)
RDW: 13.1 % (ref 11.5–15.5)
WBC: 4.8 10*3/uL (ref 4.0–10.5)
nRBC: 0 % (ref 0.0–0.2)

## 2022-12-01 NOTE — ED Triage Notes (Addendum)
Pt reports woke up with sharp chest pain worse with inspiration. She states pain was radiating up right side into neck. She has hx of spontaneous coronary artery dissection. Denies cough or fevers.

## 2022-12-01 NOTE — Discharge Instructions (Signed)
You were seen in the emergency department for chest pain.  You had blood work EKG chest x-ray that did not show an obvious cause of your symptoms.  Please contact your cardiology team for close follow-up.  Return to the emergency department if any worsening or concerning symptoms.

## 2022-12-01 NOTE — ED Provider Notes (Signed)
[place] EMERGENCY DEPARTMENT AT [facility] Provider Note   CSN: [id number] Arrival date & time: 12/01/22  1308     History  Chief Complaint  Patient presents with   Chest Pain    Tamesa Dallis is a 47 y.o. female.  HPI     This is a 48 year old female who presents with chest pain.  Patient reports that she woke up this morning with sharp right-sided chest pain.  It is nonradiating.  It was self-limited and resolved on its own.  However, she has a history of coronary artery dissection and this concerned her.  She states that her pain is different from when she had her dissection.  She has had some shortness of breath and a dry cough but no fevers.  Denies leg swelling or history of blood clots.  Currently she is asymptomatic.   Home Medications Prior to Admission medications   Medication Sig Start Date End Date Taking? Authorizing Provider  aspirin 81 MG chewable tablet Chew 1 tablet (81 mg total) by mouth daily. 02/13/20   Filbert Schilder, NP  atorvastatin (LIPITOR) 10 MG tablet TAKE 1/2 TABLET BY MOUTH DAILY 06/13/22   Wendall Stade, MD  levothyroxine (SYNTHROID, LEVOTHROID) 100 MCG tablet Take 100 mcg by mouth daily before breakfast.     [provider]  metoprolol succinate (TOPROL-XL) 25 MG 24 hr tablet TAKE 1 TABLET (25 MG TOTAL) BY MOUTH DAILY. 06/13/22   Wendall Stade, MD  Multiple Vitamin (MULTIVITAMIN ADULT PO) Take 1 tablet by mouth daily.    [provider]  nitroGLYCERIN (NITROSTAT) 0.4 MG SL tablet Place 1 tablet (0.4 mg total) under the tongue every 5 (five) minutes x 3 doses as needed for chest pain. 02/12/20   Filbert Schilder, NP  Vitamin D, Ergocalciferol, (DRISDOL) 50000 units CAPS capsule Take 50,000 Units by mouth once a week. Wednesdays 09/15/17   [provider]      Allergies    Fosamax [alendronate], Latex, and Reclast [zoledronic acid]    Review of Systems   Review of Systems  Constitutional:  Negative for  fever.  Respiratory:  Positive for cough and shortness of breath.   Cardiovascular:  Positive for chest pain.  All other systems reviewed and are negative.   Physical Exam Updated Vital Signs BP 139/71 (BP Location: Right Arm)   Pulse 70   Temp 97.9 F (36.6 C)   Resp 18   LMP 11/12/2022 Comment: per pt, not pregnant  SpO2 100%  Physical Exam Vitals and nursing note reviewed.  Constitutional:      Appearance: She is well-developed.  HENT:     Head: Normocephalic and atraumatic.  Eyes:     Pupils: Pupils are equal, round, and reactive to light.  Cardiovascular:     Rate and Rhythm: Normal rate and regular rhythm.     Heart sounds: Normal heart sounds.  Pulmonary:     Effort: Pulmonary effort is normal. No respiratory distress.     Breath sounds: No wheezing.  Abdominal:     General: Bowel sounds are normal.     Palpations: Abdomen is soft.  Musculoskeletal:     Cervical back: Neck supple.  Skin:    General: Skin is warm and dry.  Neurological:     Mental Status: She is alert and oriented to person, place, and time.  Psychiatric:        Mood and Affect: Mood normal.     ED Results / Procedures /  Treatments   Labs (all labs ordered are listed, but only abnormal results are displayed) Labs Reviewed  COMPREHENSIVE METABOLIC PANEL  CBC WITH DIFFERENTIAL/PLATELET  TROPONIN I (HIGH SENSITIVITY)    EKG EKG Interpretation  Date/Time:  Friday Dec 01 2022 05:44:48 EDT Ventricular Rate:  71 PR Interval:  180 QRS Duration: 80 QT Interval:  370 QTC Calculation: 402 R Axis:   72 Text Interpretation: Sinus rhythm with occasional Premature ventricular complexes Otherwise normal ECG When compared with ECG of 22-Mar-2020 12:52, PREVIOUS ECG IS PRESENT Confirmed by Ross Marcus ([id number]) on 12/01/2022 6:03:31 AM  Radiology No results found.  Procedures Procedures    Medications Ordered in ED Medications - No data to display  ED Course/ Medical Decision Making/  A&P                             Medical Decision Making Amount and/or Complexity of Data Reviewed Labs: ordered. Radiology: ordered.   This patient presents to the ED for concern of chest pain, this involves an extensive number of treatment options, and is a complaint that carries with it a high risk of complications and morbidity.  I considered the following differential and admission for this acute, potentially life threatening condition.  The differential diagnosis includes ACS, PE, pneumothorax, pneumonia  MDM:    This is a 47 year old female with a history of spontaneous coronary artery dissection who presents with chest pain.  She is nontoxic and vital signs are largely reassuring.  Blood pressure appears fairly well-controlled.  She states that her pain is somewhat different from her prior coronary artery dissection although just what she is concerned for.  She is otherwise low risk for ACS.  EKG shows no evidence of acute ischemia or arrhythmia.  Troponin x 1 is negative.  Repeat pending.  Chest x-ray shows no evidence of pneumothorax or pneumonia.  She is PERC neg. signed out to oncoming provider pending repeat troponin.  If negative can follow-up with cardiology as an outpatient.  (Labs, imaging, consults)  Labs: I Ordered, and personally interpreted labs.  The pertinent results include: CBC, BMP, troponin  Imaging Studies ordered: I ordered imaging studies including chest x-ray I independently visualized and interpreted imaging. I agree with the radiologist interpretation  Additional history obtained from chart review.  External records from outside source obtained and reviewed including cardiac catheterization  Cardiac Monitoring: The patient was maintained on a cardiac monitor.  If on the cardiac monitor, I personally viewed and interpreted the cardiac monitored which showed an underlying rhythm of: Sinus rhythm  Reevaluation: After the interventions noted above, I  reevaluated the patient and found that they have :improved  Social Determinants of Health:  lives independently  Disposition: Discharge  Co morbidities that complicate the patient evaluation  Past Medical History:  Diagnosis Date   Benign neoplasm of thyroid gland    Breast cyst 2014   Iron deficiency anemia    Osteopenia    Spontaneous dissection of coronary artery    Thyroid disease    hypothyroidism   Vitamin D deficiency 03/25/2018     Medicines No orders of the defined types were placed in this encounter.   I have reviewed the patients home medicines and have made adjustments as needed  Problem List / ED Course: Problem List Items Addressed This Visit       Cardiovascular and Mediastinum   Spontaneous dissection of coronary artery   Relevant Orders  Ambulatory referral to Cardiology   Other Visit Diagnoses     Nonspecific chest pain    -  Primary   Relevant Orders   Ambulatory referral to Cardiology                   Final Clinical Impression(s) / ED Diagnoses Final diagnoses:  None    Rx / DC Orders ED Discharge Orders     None         Shon Baton, MD 12/01/22 2300

## 2022-12-01 NOTE — ED Provider Notes (Signed)
Signout from Dr. Wilkie Aye.  47 year old female here with chest pain.  Prior history of coronary dissection.  First troponin negative.  Plan is to follow-up on second troponin.  If negative patient can follow-up outpatient with cardiology. Physical Exam  BP 139/71 (BP Location: Right Arm)   Pulse 70   Temp 97.9 F (36.6 C)   Resp 18   LMP 11/12/2022 Comment: per pt, not pregnant  SpO2 100%   Physical Exam  Procedures  Procedures  ED Course / MDM    Medical Decision Making Amount and/or Complexity of Data Reviewed Labs: ordered. Radiology: ordered.   Repeat troponin is 6 unchanged from prior.  I reviewed this with patient and she is comfortable plan for outpatient follow-up with her cardiology team.  Return instructions discussed       Terrilee Files, MD 12/02/22 [phone]
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
## 2022-12-06 ENCOUNTER — Telehealth (INDEPENDENT_AMBULATORY_CARE_PROVIDER_SITE_OTHER): Payer: Medicaid Other | Admitting: Family Medicine

## 2022-12-06 ENCOUNTER — Encounter: Payer: Self-pay | Admitting: Family Medicine

## 2022-12-06 VITALS — Ht 69.5 in | Wt 161.0 lb

## 2022-12-06 DIAGNOSIS — F410 Panic disorder [episodic paroxysmal anxiety] without agoraphobia: Secondary | ICD-10-CM

## 2022-12-06 DIAGNOSIS — F341 Dysthymic disorder: Secondary | ICD-10-CM

## 2022-12-06 DIAGNOSIS — E039 Hypothyroidism, unspecified: Secondary | ICD-10-CM

## 2022-12-06 DIAGNOSIS — Z630 Problems in relationship with spouse or partner: Secondary | ICD-10-CM | POA: Diagnosis not present

## 2022-12-06 DIAGNOSIS — N951 Menopausal and female climacteric states: Secondary | ICD-10-CM

## 2022-12-07 NOTE — Progress Notes (Signed)
CARDIOLOGY OFFICE NOTE  Date:  12/08/2022    Stefanie [name] Date of Birth: 1976-02-16 Medical Record #[id number]  PCP:  Stefanie Cory, MD  Cardiologist:  Stefanie [name]  No chief complaint on file.   History of Present Illness: Stefanie [name] is a 47 y.o. 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  Otherwise had normal left main, LAD, LCX and RCA along with   normal LV function.  It was recommended to be on beta-blocker therapy and blood pressure control to less than 120/80.  Plavix and aspirin added for at least 6 months along with statin therapy. She was to consider vascular screening as well for fibromuscular dysplasia. Sed rate and CRP were normal.   She was seen by Stefanie Reedy, PA 02/24/20 . Very fatigued on metoprolol and this was changed to Toprol.   Back to the ER 03/22/20 with chest pain but left AMA.  HS troponin was negative x 2.   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 Just diagnosed with Parkinson's  Mom is nearby and helps out   12/05/21 seen by primary for left sided Sciatica after trying to lift a patient at work Rx steroid taper and Lyrica   In ER 12/01/22 with chest pain r/o d/c from ER   Pain for 2 minutes while awakening resolved spontaneously Has not recurred     Past Medical History:  Diagnosis Date   Benign neoplasm of thyroid gland    Breast cyst 2014   Iron deficiency anemia    Osteopenia    Spontaneous dissection of coronary artery    Thyroid disease    hypothyroidism   Vitamin D  deficiency 03/25/2018    Past Surgical History:  Procedure Laterality Date   BREAST SURGERY     left aspiration    CESAREAN SECTION  07/20/2016   COLONOSCOPY WITH PROPOFOL N/A 08/04/2021   Procedure: COLONOSCOPY WITH PROPOFOL;  Surgeon: Toney Reil, MD;  Location: [facility] ENDOSCOPY;  Service: Gastroenterology;  Laterality: N/A;   LEFT HEART CATH AND CORONARY ANGIOGRAPHY N/A 02/09/2020   Procedure: LEFT HEART CATH AND CORONARY ANGIOGRAPHY;  Surgeon: Lyn Records, MD;  Location: [facility] INVASIVE CV LAB;  Service: Cardiovascular;  Laterality: N/A;     Medications: Current Meds  Medication Sig   aspirin 81 MG chewable tablet Chew 1 tablet (81 mg total) by mouth daily.   atorvastatin (LIPITOR) 10 MG tablet TAKE 1/2 TABLET BY MOUTH DAILY   levothyroxine (SYNTHROID, LEVOTHROID) 100 MCG tablet Take 100 mcg by mouth daily before breakfast.    metoprolol succinate (TOPROL-XL) 25 MG 24 hr tablet TAKE 1 TABLET (25 MG TOTAL) BY MOUTH DAILY.   Multiple Vitamin (MULTIVITAMIN ADULT PO) Take 1 tablet by mouth daily.   nitroGLYCERIN (NITROSTAT) 0.4 MG SL tablet Place 1 tablet (0.4 mg total) under the tongue every 5 (five) minutes x 3 doses as needed for chest pain.   Vitamin D, Ergocalciferol, (DRISDOL) 50000 units CAPS capsule Take 50,000 Units by mouth once a week. Wednesdays  Allergies: Allergies  Allergen Reactions   Fosamax [Alendronate] Other (See Comments)    stomachache   Latex Itching   Reclast [Zoledronic Acid] Rash    Social History: The patient  reports that she has never smoked. She has never used smokeless tobacco. She reports that she does not drink alcohol and does not use drugs.   Family History: The patient's family history includes Alzheimer's disease in her paternal grandmother; CVA (age of onset: 74) in her father; Cancer (age of onset: 3) in her maternal grandmother; Cancer - Colon (age of onset: 32) in her mother; Diabetes Mellitus II in her father; Hyperlipidemia in  her mother; Hypertension in her mother.   Review of Systems: Please see the history of present illness.   All other systems are reviewed and negative.   Physical Exam: VS:  BP 110/78   Pulse 74   Ht 5\' 10"  (1.778 m)   Wt 158 lb 6.4 oz (71.8 kg)   LMP 12/01/2022 (Approximate)   SpO2 99%   BMI 22.73 kg/m  .  BMI Body mass index is 22.73 kg/m.  Wt Readings from Last 3 Encounters:  12/08/22 158 lb 6.4 oz (71.8 kg)  12/06/22 161 lb (73 kg)  08/28/22 161 lb 1.6 oz (73.1 kg)    Affect appropriate Healthy:  appears stated age HEENT: normal Neck supple with no adenopathy JVP normal no bruits no thyromegaly Lungs clear with no wheezing and good diaphragmatic motion Heart:  S1/S2 no murmur, no rub, gallop or click PMI normal Abdomen: benighn, BS positve, no tenderness, no AAA no bruit.  No HSM or HJR Distal pulses intact with no bruits No edema Neuro non-focal Skin warm and dry No muscular weakness    LABORATORY DATA:  EKG:  03/23/20  NSR normal no acute changes 12/08/2022 SR rate 67 normal  12/08/2022  SR rate 72 PVC otherwise normal QT 374 msec   Lab Results  Component Value Date   WBC 4.8 12/01/2022   HGB 11.8 (L) 12/01/2022   HCT 35.3 (L) 12/01/2022   PLT 226 12/01/2022   GLUCOSE 105 (H) 12/01/2022   CHOL 119 08/03/2022   TRIG 41 08/03/2022   HDL 54 08/03/2022   LDLCALC 55 08/03/2022   ALT 15 12/01/2022   AST 18 12/01/2022   NA 138 12/01/2022   K 3.7 12/01/2022   CL 105 12/01/2022   CREATININE 0.70 12/01/2022   BUN 10 12/01/2022   CO2 24 12/01/2022   TSH 4.61 08/03/2022   HGBA1C 5.8 (H) 08/17/2022     BNP (last 3 results) No results for input(s): "BNP" in the last 8760 hours.  ProBNP (last 3 results) No results for input(s): "PROBNP" in the last 8760 hours.   Other Studies Reviewed Today:  Vascuscreen 12/08/22  Summary:  Right Carotid: Normal: little or no evidence of plaque visualized in the  right internal carotid artery.  Left Carotid:  Normal: little or no evidence of plaque visualized in the  left internal carotid artery.     Renal: 03/23/20     Right: Normal size right kidney. Normal right Resisitive Index.         Normal cortical thickness of right kidney. No evidence of         right renal artery stenosis. RRV flow present.  Left:  No evidence of left renal artery stenosis. LRV flow present.         Normal size of left kidney. Normal left Resistive Index.  Normal cortical thickness of the left kidney. 1-59% stenosis         of the left renal artery.  Mesenteric:  Normal Celiac artery and Superior Mesenteric artery findings.     No obvious FMD by color Doppler.     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  Patient is asymptomatic at the time of cath. Normal left main Normal LAD Otherwise normal circumflex Dominant normal right coronary artery Normal LV systolic function and end-diastolic pressure.   RECOMMENDATIONS:   Beta-blocker therapy and blood pressure control to less than 120/80 mmHg.  Renin angiotensin blockade or calcium channel blocker therapy would be considerations in addition to beta-blocker if needed. Aspirin and Plavix for least 6 months. Statin therapy Avoid full anticoagulation. Consider screening carotid and renal arteries for evidence of fibromuscular dysplasia We will check hs-CRP and sed rate.    ECHO IMPRESSIONS 01/2020   1. Left ventricular ejection fraction, by estimation, is 55 to 60%. The  left ventricle has normal function. The left ventricle has no regional  wall motion abnormalities. Left ventricular diastolic parameters were  normal.   2. Right ventricular systolic function is normal. The right ventricular  size is normal.   3. The mitral valve is normal in structure. Trivial mitral  valve  regurgitation. No evidence of mitral stenosis.   4. The aortic valve is tricuspid. Aortic valve regurgitation is not  visualized. No aortic stenosis is present.   5. The inferior vena cava is normal in size with greater than 50%  respiratory variability, suggesting right atrial pressure of 3 mmHg.      ASSESSMENT & PLAN:     1.Coronary Disection: -July 2021 stable with no recurrence . D/c plavix D/C Toprol update exercise myovue Pain did not sound cardiac   2. HLD: - on statin - LDL 47 06/09/20   3. Fatigue: - this has improved with the change to long acting Toprol - we will continue.   4. Mild RAS: - normal creatinine - no FMD noted  [place] 03/23/20 Duplex 09/14/22 with no RAS or FMD  5. Thyroid:  On synthroid replacement TSH normal 09/07/21   Current medicines are reviewed with the patient today.  The patient does not have concerns regarding medicines other than what has been noted above.  The following changes have been made:  See above.  Labs/ tests ordered today include:    No orders of the defined types were placed in this encounter.  Exercise myovue   F/U in a year    Patient is agreeable to this plan and will call if any problems develop in the interim.   Signed: Charlton Haws, MD  12/08/2022 8:23 AM  [facility] [address] [place], [place]  [address] Phone: [phone] Fax: [phone]

## 2022-12-08 ENCOUNTER — Other Ambulatory Visit: Payer: Self-pay

## 2022-12-08 ENCOUNTER — Encounter: Payer: Self-pay | Admitting: Cardiovascular Disease

## 2022-12-08 ENCOUNTER — Ambulatory Visit: Payer: Medicaid Other | Attending: Cardiovascular Disease | Admitting: Cardiovascular Disease

## 2022-12-08 VITALS — BP 110/78 | HR 74 | Ht 70.0 in | Wt 158.4 lb

## 2022-12-08 DIAGNOSIS — I2542 Coronary artery dissection: Secondary | ICD-10-CM

## 2022-12-08 DIAGNOSIS — R072 Precordial pain: Secondary | ICD-10-CM | POA: Diagnosis not present

## 2022-12-08 DIAGNOSIS — I701 Atherosclerosis of renal artery: Secondary | ICD-10-CM

## 2022-12-08 DIAGNOSIS — E039 Hypothyroidism, unspecified: Secondary | ICD-10-CM

## 2022-12-08 DIAGNOSIS — R079 Chest pain, unspecified: Secondary | ICD-10-CM

## 2022-12-08 NOTE — Progress Notes (Signed)
Ordered attestation. 

## 2022-12-08 NOTE — Patient Instructions (Addendum)
Medication Instructions:  Your physician has recommended you make the following change in your medication:  -Stop Metoprolol  *If you need a refill on your cardiac medications before your next appointment, please call your pharmacy*  Lab Work: If you have labs (blood work) drawn today and your tests are completely normal, you will receive your results only by: MyChart Message (if you have MyChart) OR A paper copy in the mail If you have any lab test that is abnormal or we need to change your treatment, we will call you to review the results.  Testing/Procedures: Your physician has requested that you have en exercise stress myoview. For further information please visit [email or web address] Please follow instruction sheet, as given.  Follow-Up: At [facility], you and your health needs are our priority.  As part of our continuing mission to provide you with exceptional heart care, we have created designated Provider Care Teams.  These Care Teams include your primary Cardiologist (physician) and Advanced Practice Providers (APPs -  Physician Assistants and Nurse Practitioners) who all work together to provide you with the care you need, when you need it.  We recommend signing up for the patient portal called "MyChart".  Sign up information is provided on this After Visit Summary.  MyChart is used to connect with patients for Virtual Visits (Telemedicine).  Patients are able to view lab/test results, encounter notes, upcoming appointments, etc.  Non-urgent messages can be sent to your provider as well.   To learn more about what you can do with MyChart, go to [email or web address].    Your next appointment:   1 year(s)  Provider:   Charlton Haws, MD

## 2022-12-08 NOTE — Addendum Note (Signed)
Addended by: Virl Axe, Watson Robarge L on: 12/08/2022 08:32 AM   Modules accepted: Orders

## 2022-12-11 ENCOUNTER — Encounter (HOSPITAL_COMMUNITY): Payer: Self-pay | Admitting: *Deleted

## 2022-12-11 ENCOUNTER — Telehealth (HOSPITAL_COMMUNITY): Payer: Self-pay | Admitting: *Deleted

## 2022-12-11 NOTE — Telephone Encounter (Signed)
Letter sent with instructions for upcoming stress test to patient via my chart.  Stefanie [name]

## 2022-12-13 ENCOUNTER — Ambulatory Visit (HOSPITAL_COMMUNITY): Payer: Medicaid Other | Attending: Internal Medicine

## 2022-12-13 ENCOUNTER — Telehealth: Payer: Self-pay

## 2022-12-13 DIAGNOSIS — I2542 Coronary artery dissection: Secondary | ICD-10-CM | POA: Diagnosis not present

## 2022-12-13 DIAGNOSIS — I701 Atherosclerosis of renal artery: Secondary | ICD-10-CM | POA: Insufficient documentation

## 2022-12-13 DIAGNOSIS — R072 Precordial pain: Secondary | ICD-10-CM | POA: Diagnosis not present

## 2022-12-13 DIAGNOSIS — R079 Chest pain, unspecified: Secondary | ICD-10-CM | POA: Insufficient documentation

## 2022-12-13 DIAGNOSIS — E039 Hypothyroidism, unspecified: Secondary | ICD-10-CM | POA: Diagnosis not present

## 2022-12-13 LAB — MYOCARDIAL PERFUSION IMAGING
Angina Index: 0
Duke Treadmill Score: 6
Estimated workload: 7
Exercise duration (min): 5 min
Exercise duration (sec): 32 s
LV dias vol: 85 mL (ref 46–106)
LV sys vol: 38 mL
MPHR: 172 {beats}/min
Nuc Stress EF: 55 %
Peak HR: 160 {beats}/min
Percent HR: 93 %
Rest HR: 72 {beats}/min
Rest Nuclear Isotope Dose: 10.2 mCi
SDS: 0
SRS: 0
SSS: 0
ST Depression (mm): 0 mm
Stress Nuclear Isotope Dose: 32.9 mCi
TID: 0.86

## 2022-12-13 MED ORDER — TECHNETIUM TC 99M TETROFOSMIN IV KIT
10.2000 | PACK | Freq: Once | INTRAVENOUS | Status: AC | PRN
  Administered 2022-12-13: 10.2 via INTRAVENOUS

## 2022-12-13 MED ORDER — TECHNETIUM TC 99M TETROFOSMIN IV KIT
32.9000 | PACK | Freq: Once | INTRAVENOUS | Status: AC | PRN
  Administered 2022-12-13: 32.9 via INTRAVENOUS

## 2022-12-13 NOTE — Telephone Encounter (Signed)
Patient is aware of Myo stress test results. She verbalized understanding

## 2022-12-13 NOTE — Telephone Encounter (Signed)
-----   Message from Wendall Stade, MD sent at 12/13/2022  1:42 PM EDT ----- Normal myovue study with no evidence of ischemia or infarction

## 2022-12-15 ENCOUNTER — Other Ambulatory Visit: Payer: Self-pay | Admitting: Cardiovascular Disease

## 2022-12-30 DIAGNOSIS — Z419 Encounter for procedure for purposes other than remedying health state, unspecified: Secondary | ICD-10-CM | POA: Diagnosis not present

## 2023-01-05 DIAGNOSIS — E890 Postprocedural hypothyroidism: Secondary | ICD-10-CM | POA: Diagnosis not present

## 2023-01-05 DIAGNOSIS — E0511 Thyrotoxicosis with toxic single thyroid nodule with thyrotoxic crisis or storm: Secondary | ICD-10-CM | POA: Diagnosis not present

## 2023-01-05 DIAGNOSIS — D34 Benign neoplasm of thyroid gland: Secondary | ICD-10-CM | POA: Diagnosis not present

## 2023-01-05 DIAGNOSIS — R002 Palpitations: Secondary | ICD-10-CM | POA: Diagnosis not present

## 2023-01-29 DIAGNOSIS — Z419 Encounter for procedure for purposes other than remedying health state, unspecified: Secondary | ICD-10-CM | POA: Diagnosis not present

## 2023-02-15 DIAGNOSIS — E559 Vitamin D deficiency, unspecified: Secondary | ICD-10-CM | POA: Diagnosis not present

## 2023-02-15 DIAGNOSIS — R002 Palpitations: Secondary | ICD-10-CM | POA: Diagnosis not present

## 2023-02-15 DIAGNOSIS — E0511 Thyrotoxicosis with toxic single thyroid nodule with thyrotoxic crisis or storm: Secondary | ICD-10-CM | POA: Diagnosis not present

## 2023-02-15 DIAGNOSIS — E890 Postprocedural hypothyroidism: Secondary | ICD-10-CM | POA: Diagnosis not present

## 2023-02-19 DIAGNOSIS — D34 Benign neoplasm of thyroid gland: Secondary | ICD-10-CM | POA: Diagnosis not present

## 2023-02-19 DIAGNOSIS — E890 Postprocedural hypothyroidism: Secondary | ICD-10-CM | POA: Diagnosis not present

## 2023-02-19 DIAGNOSIS — E0511 Thyrotoxicosis with toxic single thyroid nodule with thyrotoxic crisis or storm: Secondary | ICD-10-CM | POA: Diagnosis not present

## 2023-02-19 DIAGNOSIS — M810 Age-related osteoporosis without current pathological fracture: Secondary | ICD-10-CM | POA: Diagnosis not present

## 2023-02-19 DIAGNOSIS — E559 Vitamin D deficiency, unspecified: Secondary | ICD-10-CM | POA: Diagnosis not present

## 2023-03-01 DIAGNOSIS — Z419 Encounter for procedure for purposes other than remedying health state, unspecified: Secondary | ICD-10-CM | POA: Diagnosis not present

## 2023-03-20 ENCOUNTER — Emergency Department: Payer: Medicaid Other

## 2023-03-20 ENCOUNTER — Emergency Department
Admission: EM | Admit: 2023-03-20 | Discharge: 2023-03-20 | Disposition: A | Discharge status: Home / Self Care | Payer: Medicaid Other | Attending: Emergency Medicine | Admitting: Emergency Medicine

## 2023-03-20 ENCOUNTER — Encounter: Payer: Self-pay | Admitting: Emergency Medicine

## 2023-03-20 ENCOUNTER — Other Ambulatory Visit: Payer: Self-pay

## 2023-03-20 DIAGNOSIS — K5732 Diverticulitis of large intestine without perforation or abscess without bleeding: Secondary | ICD-10-CM | POA: Insufficient documentation

## 2023-03-20 DIAGNOSIS — R1032 Left lower quadrant pain: Secondary | ICD-10-CM

## 2023-03-20 DIAGNOSIS — K5792 Diverticulitis of intestine, part unspecified, without perforation or abscess without bleeding: Secondary | ICD-10-CM | POA: Diagnosis not present

## 2023-03-20 DIAGNOSIS — K429 Umbilical hernia without obstruction or gangrene: Secondary | ICD-10-CM | POA: Diagnosis not present

## 2023-03-20 DIAGNOSIS — R109 Unspecified abdominal pain: Secondary | ICD-10-CM | POA: Diagnosis not present

## 2023-03-20 DIAGNOSIS — R1084 Generalized abdominal pain: Secondary | ICD-10-CM | POA: Diagnosis present

## 2023-03-20 LAB — BASIC METABOLIC PANEL
Anion gap: 10 (ref 5–15)
BUN: 7 mg/dL (ref 6–20)
CO2: 24 mmol/L (ref 22–32)
Calcium: 9 mg/dL (ref 8.9–10.3)
Chloride: 102 mmol/L (ref 98–111)
Creatinine, Ser: 0.59 mg/dL (ref 0.44–1.00)
GFR, Estimated: >60 mL/min (ref >60)
Glucose, Bld: 111 mg/dL — ABNORMAL HIGH (ref 70–99)
Potassium: 4.1 mmol/L (ref 3.5–5.1)
Sodium: 136 mmol/L (ref 135–145)

## 2023-03-20 LAB — URINALYSIS, ROUTINE W REFLEX MICROSCOPIC
Bilirubin Urine: NEGATIVE
Glucose, UA: NEGATIVE mg/dL
Hgb urine dipstick: NEGATIVE
Ketones, ur: NEGATIVE mg/dL
Leukocytes,Ua: NEGATIVE
Nitrite: NEGATIVE
Protein, ur: NEGATIVE mg/dL
Specific Gravity, Urine: 1.008 (ref 1.005–1.030)
pH: 7 (ref 5.0–8.0)

## 2023-03-20 LAB — CBC
HCT: 36.1 % (ref 36.0–46.0)
Hemoglobin: 11.9 g/dL — ABNORMAL LOW (ref 12.0–15.0)
MCH: 29 pg (ref 26.0–34.0)
MCHC: 33 g/dL (ref 30.0–36.0)
MCV: 87.8 fL (ref 80.0–100.0)
Platelets: 250 10*3/uL (ref 150–400)
RBC: 4.11 MIL/uL (ref 3.87–5.11)
RDW: 12.8 % (ref 11.5–15.5)
WBC: 7.9 10*3/uL (ref 4.0–10.5)
nRBC: 0 % (ref 0.0–0.2)

## 2023-03-20 LAB — POC URINE PREG, ED: Preg Test, Ur: NEGATIVE

## 2023-03-20 MED ORDER — CIPROFLOXACIN HCL 500 MG PO TABS
500.0000 mg | ORAL_TABLET | Freq: Once | ORAL | Status: AC
  Administered 2023-03-20: 500 mg via ORAL
  Filled 2023-03-20: qty 1

## 2023-03-20 MED ORDER — METRONIDAZOLE 500 MG PO TABS: 500.0000 mg | ORAL_TABLET | Freq: Three times a day (TID) | ORAL | 0 refills | Status: AC

## 2023-03-20 MED ORDER — METRONIDAZOLE 500 MG PO TABS: 500.0000 mg | ORAL_TABLET | Freq: Three times a day (TID) | ORAL | 0 refills | Status: DC

## 2023-03-20 MED ORDER — IOHEXOL 300 MG/ML  SOLN
100.0000 mL | Freq: Once | INTRAMUSCULAR | Status: AC | PRN
  Administered 2023-03-20: 100 mL via INTRAVENOUS

## 2023-03-20 MED ORDER — CIPROFLOXACIN HCL 500 MG PO TABS: 500.0000 mg | ORAL_TABLET | Freq: Two times a day (BID) | ORAL | 0 refills | Status: AC

## 2023-03-20 MED ORDER — ONDANSETRON 4 MG PO TBDP: 4.0000 mg | ORAL_TABLET | Freq: Three times a day (TID) | ORAL | 0 refills | Status: DC | PRN

## 2023-03-20 MED ORDER — CIPROFLOXACIN HCL 500 MG PO TABS: 500.0000 mg | ORAL_TABLET | Freq: Two times a day (BID) | ORAL | 0 refills | Status: DC

## 2023-03-20 MED ORDER — METRONIDAZOLE 500 MG PO TABS
500.0000 mg | ORAL_TABLET | Freq: Once | ORAL | Status: AC
  Administered 2023-03-20: 500 mg via ORAL
  Filled 2023-03-20: qty 1

## 2023-03-20 MED ORDER — FLUCONAZOLE 150 MG PO TABS: 150.0000 mg | ORAL_TABLET | Freq: Once | ORAL | 0 refills | Status: AC

## 2023-03-20 NOTE — ED Triage Notes (Signed)
Pt via POV from home. Pt c/o RLQ abd pain that radiates to her back that started last week and worsened last night. Report nausea and diarrhea. Denies fever. Pt is A&Ox4 and NAD

## 2023-03-20 NOTE — ED Provider Notes (Signed)
----------------------------------------- 3:55 PM on 03/20/2023 -----------------------------------------  Blood pressure 126/70, pulse 99, temperature 98.1 F (36.7 C), temperature source Oral, resp. rate 18, height 5\' 10"  (1.778 m), weight 70.3 kg, last menstrual period 03/06/2023, SpO2 99%.  Assuming care from Dr. Bridget Hartshorn, PA-C/NP-C.  In short, Stefanie [name] is a 47 y.o. female with a chief complaint of Flank Pain .  Refer to the original H&P for additional details.  The current plan of care is to await pending CT results and disposition accordingly.  ____________________________________________    ED Results / Procedures / Treatments   Labs (all labs ordered are listed, but only abnormal results are displayed) Labs Reviewed  URINALYSIS, ROUTINE W REFLEX MICROSCOPIC - Abnormal; Notable for the following components:      Result Value   Color, Urine YELLOW (*)    APPearance CLEAR (*)    All other components within normal limits  BASIC METABOLIC PANEL - Abnormal; Notable for the following components:   Glucose, Bld 111 (*)    All other components within normal limits  CBC - Abnormal; Notable for the following components:   Hemoglobin 11.9 (*)    All other components within normal limits  POC URINE PREG, ED     EKG   RADIOLOGY  I personally viewed and evaluated these images as part of my medical decision making, as well as reviewing the written report by the radiologist.  ED Provider Interpretation: Acute diverticulitis}  CT ABDOMEN PELVIS W CONTRAST  Result Date: 03/20/2023 CLINICAL DATA:  Abdominal pain since last week. EXAM: CT ABDOMEN AND PELVIS WITH CONTRAST TECHNIQUE: Multidetector CT imaging of the abdomen and pelvis was performed using the standard protocol following bolus administration of intravenous contrast. RADIATION DOSE REDUCTION: This exam was performed according to the departmental dose-optimization program which includes automated exposure  control, adjustment of the mA and/or kV according to patient size and/or use of iterative reconstruction technique. CONTRAST:  OMNIPAQUE IOHEXOL 300 MG/ML  SOLN COMPARISON:  None Available. FINDINGS: Lower chest: No acute abnormality. Hepatobiliary: Few tiny subcentimeter low-density lesions in the liver are too small to characterize. Normal gallbladder. No biliary dilatation. Pancreas: Unremarkable. No pancreatic ductal dilatation or surrounding inflammatory changes. Spleen: Normal in size without focal abnormality. Adrenals/Urinary Tract: Adrenal glands are unremarkable. Kidneys are normal, without renal calculi, focal lesion, or hydronephrosis. Bladder is unremarkable. Stomach/Bowel: Diffuse colonic diverticulosis. Prominent wall thickening of the proximal to mid descending colon adjacent to an inflamed diverticulum. Trace fluid in the left paracolic gutter. No extraluminal air or fluid collection. The stomach and small bowel are unremarkable. Normal appendix. Vascular/Lymphatic: No significant vascular findings are present. No enlarged abdominal or pelvic lymph nodes. Reproductive: Uterus and bilateral adnexa are unremarkable. Other: Trace free fluid in the pelvis. Small umbilical hernia containing fat and a knuckle of small bowel. No pneumoperitoneum. Musculoskeletal: No acute or significant osseous findings. IMPRESSION: 1. Acute diverticulitis of the descending colon. No perforation or abscess. Electronically Signed   By: Obie Dredge M.D.   On: 03/20/2023 16:36     PROCEDURES:  Critical Care performed: No  Procedures   MEDICATIONS ORDERED IN ED: Medications  ciprofloxacin (CIPRO) tablet 500 mg (has no administration in time range)  metroNIDAZOLE (FLAGYL) tablet 500 mg (has no administration in time range)  iohexol (OMNIPAQUE) 300 MG/ML solution 100 mL (100 mLs Intravenous Contrast Given 03/20/23 1507)     IMPRESSION / MDM / ASSESSMENT AND PLAN / ED COURSE  I reviewed the triage  vital signs and the  nursing notes.                              Differential diagnosis includes, but is not limited to, ovarian cyst, ovarian torsion, acute appendicitis, diverticulitis, urinary tract infection/pyelonephritis, endometriosis, bowel obstruction, colitis, renal colic, gastroenteritis, hernia, fibroids, endometriosis, pregnancy related pain including ectopic pregnancy, etc.   Patient's presentation is most consistent with acute complicated illness / injury requiring diagnostic workup.  Patient's diagnosis is consistent with diverticulitis. Patient will be discharged home with prescriptions for metronidazole, Cipro, Zofran, and Diflucan. Patient is to follow up with PCP or GI medicine as discussed, as needed or otherwise directed. Patient is given ED precautions to return to the ED for any worsening or new symptoms.   FINAL CLINICAL IMPRESSION(S) / ED DIAGNOSES   Final diagnoses:  Abdominal pain, LLQ  Diverticulitis     Rx / DC Orders   ED Discharge Orders          Ordered    ciprofloxacin (CIPRO) 500 MG tablet  2 times daily,   Status:  Discontinued        03/20/23 1700    metroNIDAZOLE (FLAGYL) 500 MG tablet  3 times daily,   Status:  Discontinued        03/20/23 1700    fluconazole (DIFLUCAN) 150 MG tablet   Once        03/20/23 1700    ondansetron (ZOFRAN-ODT) 4 MG disintegrating tablet  Every 8 hours PRN        03/20/23 1700    ciprofloxacin (CIPRO) 500 MG tablet  2 times daily        03/20/23 1702    metroNIDAZOLE (FLAGYL) 500 MG tablet  3 times daily        03/20/23 1702             Note:  This document was prepared using Dragon voice recognition software and may include unintentional dictation errors.    Lissa Hoard, PA-C 03/20/23 1704    Trinna Post, MD 03/22/23 [phone]

## 2023-03-20 NOTE — Discharge Instructions (Addendum)
The 2 antibiotics as directed.  Take nausea medicine as needed.  Start with a liquid diet and advance as tolerated.  Follow-up with your primary provider for ongoing care.  Consider GI referral for further evaluation.  Return to the ED for worsening symptoms as discussed.

## 2023-03-20 NOTE — ED Provider Notes (Signed)
[facility] Provider Note    Event Date/Time   First MD Initiated Contact with Patient 03/20/23 1139     (approximate)   History   Flank Pain   HPI  Stefanie [name] is a 47 y.o. female   presents to the ED with complaint of generalized abdominal pain that started last week and has worsened since last evening.  Patient also reports nausea without vomiting and diarrhea 4 times in the last 24 hours.  Patient has had some chills but denies any known fever.  No prior history of urolithiasis but positive for UTIs in the past.  Denies history of hypothyroidism, vitamin D deficiency, unstable angina, history non-STEMI, spontaneous dissection of coronary artery, uterine leiomyoma.        Physical Exam   Triage Vital Signs: ED Triage Vitals  Encounter Vitals Group     BP 03/20/23 1123 126/70     Systolic BP Percentile --      Diastolic BP Percentile --      Pulse Rate 03/20/23 1123 99     Resp 03/20/23 1123 18     Temp 03/20/23 1123 98.1 F (36.7 C)     Temp Source 03/20/23 1123 Oral     SpO2 03/20/23 1123 99 %     Weight 03/20/23 1117 155 lb (70.3 kg)     Height 03/20/23 1117 5\' 10"  (1.778 m)     Head Circumference --      Peak Flow --      Pain Score 03/20/23 1117 9     Pain Loc --      Pain Education --      Exclude from Growth Chart --     Most recent vital signs: Vitals:   03/20/23 1123  BP: 126/70  Pulse: 99  Resp: 18  Temp: 98.1 F (36.7 C)  SpO2: 99%     General: Awake, no distress.  CV:  Good peripheral perfusion.  Heart regular rate and rhythm. Resp:  Normal effort.  Lungs are clear bilaterally. Abd:  No distention.  Soft, diffusely tender throughout but subjectively increased tenderness left lower quadrant area.  Bowel sounds are normoactive x 4 quadrants. Other:     ED Results / Procedures / Treatments   Labs (all labs ordered are listed, but only abnormal results are displayed) Labs Reviewed  URINALYSIS, ROUTINE W  REFLEX MICROSCOPIC - Abnormal; Notable for the following components:      Result Value   Color, Urine YELLOW (*)    APPearance CLEAR (*)    All other components within normal limits  BASIC METABOLIC PANEL - Abnormal; Notable for the following components:   Glucose, Bld 111 (*)    All other components within normal limits  CBC - Abnormal; Notable for the following components:   Hemoglobin 11.9 (*)    All other components within normal limits  POC URINE PREG, ED     RADIOLOGY CT abdomen pelvis pending at this time.    PROCEDURES:  Critical Care performed:   Procedures   MEDICATIONS ORDERED IN ED: Medications  iohexol (OMNIPAQUE) 300 MG/ML solution 100 mL (100 mLs Intravenous Contrast Given 03/20/23 1507)     IMPRESSION / MDM / ASSESSMENT AND PLAN / ED COURSE  I reviewed the triage vital signs and the nursing notes.   Differential diagnosis includes, but is not limited to, viral gastroenteritis, colitis, diverticulitis.  ----------------------------------------- 3:59 PM on 03/20/2023 ----------------------------------------- At this time CT scan pelvis and abdomen are pending.  [name], PA-C has been advised and is willing to take over the remaining care of this patient pending her CT report.      Patient's presentation is most consistent with acute illness / injury with system symptoms.  FINAL CLINICAL IMPRESSION(S) / ED DIAGNOSES   Final diagnoses:  Abdominal pain, LLQ     Rx / DC Orders   ED Discharge Orders     None        Note:  This document was prepared using Dragon voice recognition software and may include unintentional dictation errors.   Tommi Rumps, PA-C 03/20/23 1601    Trinna Post, MD 03/22/23 [phone]

## 2023-06-22 DIAGNOSIS — H5213 Myopia, bilateral: Secondary | ICD-10-CM | POA: Diagnosis not present

## 2023-06-26 ENCOUNTER — Encounter: Payer: Self-pay | Admitting: Physician Assistant

## 2023-06-26 ENCOUNTER — Ambulatory Visit (INDEPENDENT_AMBULATORY_CARE_PROVIDER_SITE_OTHER): Payer: Medicaid Other | Admitting: Physician Assistant

## 2023-06-26 VITALS — BP 124/76 | HR 88 | Temp 98.5°F | Resp 16 | Ht 70.0 in | Wt 158.8 lb

## 2023-06-26 DIAGNOSIS — J069 Acute upper respiratory infection, unspecified: Secondary | ICD-10-CM | POA: Diagnosis not present

## 2023-06-26 DIAGNOSIS — R059 Cough, unspecified: Secondary | ICD-10-CM

## 2023-06-26 LAB — POCT INFLUENZA A/B
Influenza A, POC: NEGATIVE
Influenza B, POC: NEGATIVE

## 2023-06-26 NOTE — Patient Instructions (Signed)

## 2023-06-26 NOTE — Progress Notes (Signed)
Acute Office Visit   Patient: Stefanie [name]   DOB: [birth year]   47 y.o. Female  MRN: [id number] Visit Date: 06/26/2023  Today's healthcare provider: Oswaldo Conroy Terren Haberle, PA-C  Introduced myself to the patient as a Secondary school teacher and provided education on APPs in clinical practice.    Chief Complaint  Patient presents with   Headache   Nasal Congestion   Cough   Chills    Sx onset for 6 days   Subjective    HPI HPI     Chills    Additional comments: Sx onset for 6 days      Last edited by Forde Radon, CMA on 06/26/2023  9:14 AM.      URI -type symptoms  Onset: sudden  Duration: ongoing for 6 days (started Thurs 06/21/23) Associated symptoms: congestion, postnasal drainage, productive coughing, chills, Fevers have resolved (tmax 102)   She thinks SOB has been getting better the past few days    Interventions:She has been taking Alkaseltzer cold and flu, Emergen-C vitamins   Recent sick contacts: no recent sick contacts  Recent travel: she went to [place] with friends and came back Tuesday (06/19/23) COVID testing at home: she has tested for COVID at home but test was expired   Result:negative for all three    Medications: Outpatient Medications Prior to Visit  Medication Sig   aspirin 81 MG chewable tablet Chew 1 tablet (81 mg total) by mouth daily.   atorvastatin (LIPITOR) 10 MG tablet TAKE 1/2 TABLET BY MOUTH DAILY   levothyroxine (SYNTHROID, LEVOTHROID) 100 MCG tablet Take 100 mcg by mouth daily before breakfast.    Multiple Vitamin (MULTIVITAMIN ADULT PO) Take 1 tablet by mouth daily.   nitroGLYCERIN (NITROSTAT) 0.4 MG SL tablet Place 1 tablet (0.4 mg total) under the tongue every 5 (five) minutes x 3 doses as needed for chest pain.   ondansetron (ZOFRAN-ODT) 4 MG disintegrating tablet Take 1 tablet (4 mg total) by mouth every 8 (eight) hours as needed for nausea or vomiting.   Vitamin D, Ergocalciferol, (DRISDOL) 50000 units CAPS capsule Take 50,000  Units by mouth once a week. Wednesdays   No facility-administered medications prior to visit.    Review of Systems  Constitutional:  Positive for chills, fatigue and fever.  HENT:  Positive for congestion, postnasal drip, rhinorrhea and sinus pressure. Negative for ear pain and sinus pain.   Respiratory:  Positive for cough and shortness of breath.   Gastrointestinal:  Negative for diarrhea, nausea and vomiting.  Neurological:  Positive for headaches.        Objective    BP 124/76   Pulse 88   Temp 98.5 F (36.9 C) (Oral)   Resp 16   Ht 5\' 10"  (1.778 m)   Wt 158 lb 12.8 oz (72 kg)   SpO2 98%   BMI 22.79 kg/m     Physical Exam Vitals reviewed.  Constitutional:      General: She is awake.     Appearance: Normal appearance. She is well-developed and well-groomed.  HENT:     Head: Normocephalic and atraumatic.     Right Ear: Hearing and ear canal normal. There is impacted cerumen.     Left Ear: Hearing and ear canal normal. There is impacted cerumen.     Nose: Congestion and rhinorrhea present. Rhinorrhea is clear.     Mouth/Throat:     Lips: Pink.     Mouth: Mucous membranes are moist.  Pharynx: Uvula midline. Posterior oropharyngeal erythema and postnasal drip present. No pharyngeal swelling, oropharyngeal exudate or uvula swelling.  Eyes:     General: Lids are normal. Gaze aligned appropriately.     Conjunctiva/sclera: Conjunctivae normal.  Cardiovascular:     Rate and Rhythm: Normal rate and regular rhythm.     Pulses:          Radial pulses are 2+ on the right side and 2+ on the left side.     Heart sounds: Normal heart sounds. No murmur heard.    No friction rub. No gallop.  Pulmonary:     Effort: Pulmonary effort is normal.     Breath sounds: Normal breath sounds. No decreased air movement. No decreased breath sounds, wheezing, rhonchi or rales.  Musculoskeletal:     Cervical back: Normal range of motion and neck supple.     Right lower leg: No edema.      Left lower leg: No edema.  Lymphadenopathy:     Head:     Right side of head: No submental, submandibular or preauricular adenopathy.     Left side of head: No submental, submandibular or preauricular adenopathy.     Cervical:     Right cervical: No superficial cervical adenopathy.    Left cervical: No superficial cervical adenopathy.     Upper Body:     Right upper body: No supraclavicular adenopathy.     Left upper body: No supraclavicular adenopathy.  Neurological:     Mental Status: She is alert.  Psychiatric:        Attention and Perception: Attention and perception normal.        Mood and Affect: Mood and affect normal.        Speech: Speech normal.        Behavior: Behavior normal. Behavior is cooperative.       Results for orders placed or performed in visit on 06/26/23  POCT Influenza A/B  Result Value Ref Range   Influenza A, POC Negative Negative   Influenza B, POC Negative Negative    Assessment & Plan      No follow-ups on file.      Problem List Items Addressed This Visit   None Visit Diagnoses     Upper respiratory tract infection, unspecified type    -  Primary   Cough, unspecified type       Relevant Orders   POCT Influenza A/B (Completed)   Novel Coronavirus, NAA ([facility])      Visit with patient indicates symptoms comprised of nasal congestion, productive cough, chills, postnasal drainage since 06/21/23 congruent with acute URI that is likely viral in nature  Patient has tested negative for Flu  A and B in office- results discussed during apt  Patient is outside therapeutic window for antivirals- COVID testing completed today for rule out  Due to nature and duration of symptoms recommended treatment regimen is symptomatic relief and follow up if needed Discussed with patient the various viral and bacterial etiologies of current illness and appropriate course of treatment Discussed OTC medication options for multisymptom relief such as  Dayquil/Nyquil, Theraflu, AlkaSeltzer, etc. Discussed return precautions if symptoms are not improving or worsen over next 5-7 days.     No follow-ups on file.   I, Carlos Heber E Mariell Nester, PA-C, have reviewed all documentation for this visit. The documentation on 06/26/23 for the exam, diagnosis, procedures, and orders are all accurate and complete.   Jacquelin Hawking, MHS, PA-C [facility]  [facility]

## 2023-06-28 LAB — NOVEL CORONAVIRUS, NAA: SARS-CoV-2, NAA: NOT DETECTED

## 2023-07-02 NOTE — Progress Notes (Signed)
Negative for COVID and Flu.

## 2023-07-28 ENCOUNTER — Other Ambulatory Visit: Payer: Self-pay | Admitting: Medical Genetics

## 2023-07-30 ENCOUNTER — Other Ambulatory Visit
Admission: RE | Admit: 2023-07-30 | Discharge: 2023-07-30 | Disposition: A | Discharge status: Home / Self Care | Payer: Self-pay | Source: Ambulatory Visit | Attending: Medical Genetics | Admitting: Medical Genetics

## 2023-08-09 DIAGNOSIS — H524 Presbyopia: Secondary | ICD-10-CM | POA: Diagnosis not present

## 2023-08-11 LAB — GENECONNECT MOLECULAR SCREEN: Genetic Analysis Overall Interpretation: NEGATIVE

## 2023-08-20 ENCOUNTER — Encounter: Payer: No Typology Code available for payment source | Admitting: Family Medicine

## 2023-08-21 ENCOUNTER — Encounter: Payer: Self-pay | Admitting: Family Medicine

## 2023-08-21 ENCOUNTER — Ambulatory Visit (INDEPENDENT_AMBULATORY_CARE_PROVIDER_SITE_OTHER): Payer: Medicaid Other | Admitting: Family Medicine

## 2023-08-21 VITALS — BP 116/78 | HR 73 | Temp 97.7°F | Resp 16 | Ht 70.0 in | Wt 165.1 lb

## 2023-08-21 DIAGNOSIS — Z862 Personal history of diseases of the blood and blood-forming organs and certain disorders involving the immune mechanism: Secondary | ICD-10-CM | POA: Diagnosis not present

## 2023-08-21 DIAGNOSIS — N6321 Unspecified lump in the left breast, upper outer quadrant: Secondary | ICD-10-CM | POA: Diagnosis not present

## 2023-08-21 DIAGNOSIS — E559 Vitamin D deficiency, unspecified: Secondary | ICD-10-CM | POA: Diagnosis not present

## 2023-08-21 DIAGNOSIS — R7303 Prediabetes: Secondary | ICD-10-CM | POA: Diagnosis not present

## 2023-08-21 DIAGNOSIS — E039 Hypothyroidism, unspecified: Secondary | ICD-10-CM

## 2023-08-21 DIAGNOSIS — M8580 Other specified disorders of bone density and structure, unspecified site: Secondary | ICD-10-CM | POA: Diagnosis not present

## 2023-08-21 DIAGNOSIS — N6315 Unspecified lump in the right breast, overlapping quadrants: Secondary | ICD-10-CM | POA: Diagnosis not present

## 2023-08-21 DIAGNOSIS — Z0000 Encounter for general adult medical examination without abnormal findings: Secondary | ICD-10-CM

## 2023-08-21 DIAGNOSIS — Z13220 Encounter for screening for lipoid disorders: Secondary | ICD-10-CM | POA: Diagnosis not present

## 2023-08-21 DIAGNOSIS — Z1231 Encounter for screening mammogram for malignant neoplasm of breast: Secondary | ICD-10-CM

## 2023-08-21 DIAGNOSIS — E041 Nontoxic single thyroid nodule: Secondary | ICD-10-CM | POA: Diagnosis not present

## 2023-08-21 NOTE — Progress Notes (Signed)
Name: Stefanie [name]   MRN: [id number]    DOB: [birth year]   Date:08/21/2023       Progress Note  Subjective  Chief Complaint  Chief Complaint  Patient presents with   Annual Exam    HPI  Patient presents for annual CPE.  Diet: cooks at home, balanced meals Exercise:  continue regular activity  Last Eye Exam: completed Last Dental Exam: completed  Flowsheet Row Office Visit from 08/21/2023 in [facility]  AUDIT-C Score 0      Depression: Phq 9 is  negative    08/21/2023    9:59 AM 06/26/2023    9:13 AM 12/06/2022    1:46 PM 08/17/2022    9:27 AM 12/05/2021    3:42 PM  Depression screen PHQ 2/9  Decreased Interest 0 0 0 0 0  Down, Depressed, Hopeless 0 0 1 0 0  PHQ - 2 Score 0 0 1 0 0  Altered sleeping 0  3 1   Tired, decreased energy 0  3 3   Change in appetite 0  1 0   Feeling bad or failure about yourself  0  0 0   Trouble concentrating 0  3 0   Moving slowly or fidgety/restless 0  0 0   Suicidal thoughts 0  0 0   PHQ-9 Score 0  11 4   Difficult doing work/chores Not difficult at all  Somewhat difficult     Hypertension: BP Readings from Last 3 Encounters:  08/21/23 116/78  06/26/23 124/76  03/20/23 128/82   Obesity: Wt Readings from Last 3 Encounters:  08/21/23 165 lb 1.6 oz (74.9 kg)  06/26/23 158 lb 12.8 oz (72 kg)  03/20/23 155 lb (70.3 kg)   BMI Readings from Last 3 Encounters:  08/21/23 23.69 kg/m  06/26/23 22.79 kg/m  03/20/23 22.24 kg/m     Vaccines: reviewed with the patient.   Hep C Screening: completed STD testing and prevention (HIV/chl/gon/syphilis): Not interested  Intimate partner violence: negative screen  Sexual History : one partner Menstrual History/LMP/Abnormal Bleeding: regular cycles, periods are longer, from 4 days to 5-6 days, but very light last two days Discussed importance of follow up if any post-menopausal bleeding: not applicable  Incontinence Symptoms: negative for symptoms   Breast  cancer:  - Last Mammogram: due in March  - BRCA gene screening: had a negative screen 07/30/2023   Osteoporosis Prevention : Discussed high calcium and vitamin D supplementation, weight bearing exercises Bone density :due for follow up   Cervical cancer screening: up-to-date 05/2021  Skin cancer: Discussed monitoring for atypical lesions  Colorectal cancer: up to date  Lung cancer:  Low Dose CT Chest recommended if Age 67-80 years, 20 pack-year currently smoking OR have quit w/in 15years. Patient does not qualify for screen   ECG: 11/2022   Advanced Care Planning: A voluntary discussion about advance care planning including the explanation and discussion of advance directives.  Discussed health care proxy and Living will, and the patient was able to identify a health care proxy as husband .  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   Vitamin D deficiency 03/25/2018   Hypothyroidism 09/24/2017   Umbilical hernia 09/24/2017   Thyroid nodule 05/16/2017   Uterine leiomyoma 03/07/2016    Past Surgical History:  Procedure Laterality Date   BREAST SURGERY     left aspiration    CESAREAN SECTION  07/20/2016   COLONOSCOPY WITH PROPOFOL N/A 08/04/2021   Procedure: COLONOSCOPY WITH PROPOFOL;  Surgeon: Toney Reil, MD;  Location: [facility] ENDOSCOPY;  Service: Gastroenterology;  Laterality: N/A;   LEFT HEART CATH AND CORONARY ANGIOGRAPHY N/A 02/09/2020   Procedure: LEFT HEART CATH AND CORONARY ANGIOGRAPHY;  Surgeon: Lyn Records, MD;  Location: [facility] INVASIVE CV LAB;  Service: Cardiovascular;   Laterality: N/A;    Family History  Problem Relation Age of Onset   Cancer - Colon Mother 78   Hyperlipidemia Mother    Hypertension Mother    Diabetes Mellitus II Father    CVA Father 23   Cancer Maternal Grandmother 25   Alzheimer's disease Paternal Grandmother     Social History   Socioeconomic History   Marital status: Married    Spouse name: Mariadelosangel Trauger   Number of children: 1   Years of education: 14   Highest education level: Associate degree: academic program  Occupational History    Comment: prn for health unit coordinator   Tobacco Use   Smoking status: Never   Smokeless tobacco: Never  Vaping Use   Vaping status: Never Used  Substance and Sexual Activity   Alcohol use: No   Drug use: No   Sexual activity: Yes    Partners: Male    Birth control/protection: Condom, None  Other Topics Concern   Not on file  Social History Narrative   Married since 2015, they have one child born 07/20/2016   Works part time at [employer]: Low Risk  (08/20/2023)   Overall Financial Resource Strain (CARDIA)    Difficulty of Paying Living Expenses: Not very hard  Food Insecurity: No Food Insecurity (08/20/2023)   Hunger Vital Sign    Worried About Running Out of Food in the Last Year: Never true    Ran Out of Food in the Last Year: Never true  Transportation Needs: No Transportation Needs (08/20/2023)   PRAPARE - Administrator, Civil Service (Medical): No    Lack of Transportation (Non-Medical): No  Physical Activity: Sufficiently Active (08/20/2023)   Exercise Vital Sign    Days of Exercise per Week: 5 days    Minutes of Exercise per Session: 50 min  Stress: No Stress Concern Present (08/20/2023)   [employer] of Occupational Health - Occupational Stress Questionnaire    Feeling of Stress : Not at all  Social Connections: Socially Integrated (08/20/2023)   Social Connection and Isolation Panel [NHANES]     Frequency of Communication with Friends and Family: More than three times a week    Frequency of Social Gatherings with Friends and Family: Once a week    Attends Religious Services: More than 4 times per year    Active Member of [employer] or Organizations: Yes    Attends Banker Meetings: More than 4 times per year    Marital Status: Married  Catering manager Violence: Not At Risk (08/17/2022)   Humiliation, Afraid, Rape, and Kick questionnaire    Fear of Current or Ex-Partner: No    Emotionally Abused: No  Physically Abused: No    Sexually Abused: No     Current Outpatient Medications:    aspirin 81 MG chewable tablet, Chew 1 tablet (81 mg total) by mouth daily., Disp: , Rfl:    atorvastatin (LIPITOR) 10 MG tablet, TAKE 1/2 TABLET BY MOUTH DAILY, Disp: 45 tablet, Rfl: 3   levothyroxine (SYNTHROID, LEVOTHROID) 100 MCG tablet, Take 100 mcg by mouth daily before breakfast. , Disp: , Rfl:    Multiple Vitamin (MULTIVITAMIN ADULT PO), Take 1 tablet by mouth daily., Disp: , Rfl:    nitroGLYCERIN (NITROSTAT) 0.4 MG SL tablet, Place 1 tablet (0.4 mg total) under the tongue every 5 (five) minutes x 3 doses as needed for chest pain., Disp: 25 tablet, Rfl: 0   Vitamin D, Ergocalciferol, (DRISDOL) 50000 units CAPS capsule, Take 50,000 Units by mouth once a week. Wednesdays, Disp: , Rfl: 1   ondansetron (ZOFRAN-ODT) 4 MG disintegrating tablet, Take 1 tablet (4 mg total) by mouth every 8 (eight) hours as needed for nausea or vomiting., Disp: 15 tablet, Rfl: 0  Allergies  Allergen Reactions   Fosamax [Alendronate] Other (See Comments)    stomachache   Latex Itching   Reclast [Zoledronic Acid] Rash     ROS  Constitutional: Negative for fever or weight change.  Respiratory: Negative for cough and shortness of breath.   Cardiovascular: Negative for chest pain or palpitations.  Gastrointestinal: Negative for abdominal pain, no bowel changes.  Musculoskeletal: Negative for gait  problem or joint swelling.  Skin: Negative for rash.  Neurological: Negative for dizziness or headache.  No other specific complaints in a complete review of systems (except as listed in HPI above).   Objective  Vitals:   08/21/23 1002  BP: 116/78  Pulse: 73  Resp: 16  Temp: 97.7 F (36.5 C)  TempSrc: Oral  SpO2: 100%  Weight: 165 lb 1.6 oz (74.9 kg)  Height: 5\' 10"  (1.778 m)    Body mass index is 23.69 kg/m.  Physical Exam  Constitutional: Patient appears well-developed and well-nourished. No distress.  HENT: Head: Normocephalic and atraumatic. Ears: B TMs ok, no erythema or effusion; Nose: Nose normal. Mouth/Throat: Oropharynx is clear and moist. No oropharyngeal exudate.  Eyes: Conjunctivae and EOM are normal. Pupils are equal, round, and reactive to light. No scleral icterus.  Neck: Normal range of motion. Neck supple. No JVD present.  Cardiovascular: Normal rate, regular rhythm and normal heart sounds.  No murmur heard. No BLE edema. Pulmonary/Chest: Effort normal and breath sounds normal. No respiratory distress. Abdominal: Soft. Bowel sounds are normal, no distension. There is no tenderness. no masses Breast: breast lumps on both side, large on left breast upper outer quadrant, two on right breast at 12 and 9-10 o'clock no nipple discharge or rashes FEMALE GENITALIA:  Not done  RECTAL: not done  Musculoskeletal: Normal range of motion, no joint effusions. No gross deformities Neurological: he is alert and oriented to person, place, and time. No cranial nerve deficit. Coordination, balance, strength, speech and gait are normal.  Skin: Skin is warm and dry. No rash noted. No erythema.  Psychiatric: Patient has a normal mood and affect. behavior is normal. Judgment and thought content normal.     Assessment & Plan  1. Well adult exam (Primary)  - Lipid panel - CBC with Differential/Platelet - COMPLETE METABOLIC PANEL WITH GFR - Hemoglobin A1c - VITAMIN D 25  Hydroxy (Vit-D Deficiency, Fractures) - TSH - Iron, TIBC and Ferritin Panel  2. Osteopenia, unspecified location  -  VITAMIN D 25 Hydroxy (Vit-D Deficiency, Fractures)  3. Hypothyroidism, unspecified type  - TSH - Ambulatory referral to Endocrinology  4. Prediabetes  - Hemoglobin A1c  5. Breast cancer screening by mammogram  Needs diagnostic   6. History of iron deficiency anemia  - CBC with Differential/Platelet - Iron, TIBC and Ferritin Panel  7. Lipid screening  - Lipid panel  8. Thyroid nodule  - Ambulatory referral to Endocrinology  9. Breast lump on right side at 9 o'clock position  - [place] LIMITED ULTRASOUND INCLUDING AXILLA LEFT BREAST ; Future - [place] LIMITED ULTRASOUND INCLUDING AXILLA RIGHT BREAST; Future - MM Digital Diagnostic Bilat; Future - Ambulatory referral to General Surgery  10. Breast lump on right side at 12 o'clock position  - [place] LIMITED ULTRASOUND INCLUDING AXILLA LEFT BREAST ; Future - [place] LIMITED ULTRASOUND INCLUDING AXILLA RIGHT BREAST; Future - MM Digital Diagnostic Bilat; Future - Ambulatory referral to General Surgery  11. Breast lump on left side at 2 o'clock position  - [place] LIMITED ULTRASOUND INCLUDING AXILLA LEFT BREAST ; Future - [place] LIMITED ULTRASOUND INCLUDING AXILLA RIGHT BREAST; Future - MM Digital Diagnostic Bilat; Future - Ambulatory referral to General Surgery    -USPSTF grade A and B recommendations reviewed with patient; age-appropriate recommendations, preventive care, screening tests, etc discussed and encouraged; healthy living encouraged; see AVS for patient education given to patient -Discussed importance of 150 minutes of physical activity weekly, eat two servings of fish weekly, eat one serving of tree nuts ( cashews, pistachios, pecans, almonds.Marland Kitchen) every other day, eat 6 servings of fruit/vegetables daily and drink plenty of water and avoid sweet beverages.   -Reviewed Health Maintenance: Yes.

## 2023-08-22 ENCOUNTER — Encounter: Payer: Self-pay | Admitting: Family Medicine

## 2023-08-22 LAB — COMPLETE METABOLIC PANEL WITH GFR
AG Ratio: 1.4 (calc) (ref 1.0–2.5)
ALT: 14 U/L (ref 6–29)
AST: 19 U/L (ref 10–35)
Albumin: 4.2 g/dL (ref 3.6–5.1)
Alkaline phosphatase (APISO): 60 U/L (ref 31–125)
BUN: 8 mg/dL (ref 7–25)
CO2: 27 mmol/L (ref 20–32)
Calcium: 9.6 mg/dL (ref 8.6–10.2)
Chloride: 103 mmol/L (ref 98–110)
Creat: 0.59 mg/dL (ref 0.50–0.99)
Globulin: 3 g/dL (ref 1.9–3.7)
Glucose, Bld: 90 mg/dL (ref 65–99)
Potassium: 4.3 mmol/L (ref 3.5–5.3)
Sodium: 137 mmol/L (ref 135–146)
Total Bilirubin: 0.5 mg/dL (ref 0.2–1.2)
Total Protein: 7.2 g/dL (ref 6.1–8.1)
eGFR: 110 mL/min/{1.73_m2} (ref >=60)

## 2023-08-22 LAB — LIPID PANEL
Cholesterol: 140 mg/dL (ref <200)
HDL: 58 mg/dL (ref >=50)
LDL Cholesterol (Calc): 70 mg/dL
Non-HDL Cholesterol (Calc): 82 mg/dL (ref <130)
Total CHOL/HDL Ratio: 2.4 (calc) (ref <5.0)
Triglycerides: 50 mg/dL (ref <150)

## 2023-08-22 LAB — CBC WITH DIFFERENTIAL/PLATELET
Absolute Lymphocytes: 1638 {cells}/uL (ref 850–3900)
Absolute Monocytes: 349 {cells}/uL (ref 200–950)
Basophils Absolute: 29 {cells}/uL (ref 0–200)
Basophils Relative: 0.7 %
Eosinophils Absolute: 59 {cells}/uL (ref 15–500)
Eosinophils Relative: 1.4 %
HCT: 36.6 % (ref 35.0–45.0)
Hemoglobin: 11.6 g/dL — ABNORMAL LOW (ref 11.7–15.5)
MCH: 28.9 pg (ref 27.0–33.0)
MCHC: 31.7 g/dL — ABNORMAL LOW (ref 32.0–36.0)
MCV: 91.3 fL (ref 80.0–100.0)
MPV: 11.8 fL (ref 7.5–12.5)
Monocytes Relative: 8.3 %
Neutro Abs: 2125 {cells}/uL (ref 1500–7800)
Neutrophils Relative %: 50.6 %
Platelets: 305 10*3/uL (ref 140–400)
RBC: 4.01 10*6/uL (ref 3.80–5.10)
RDW: 12 % (ref 11.0–15.0)
Total Lymphocyte: 39 %
WBC: 4.2 10*3/uL (ref 3.8–10.8)

## 2023-08-22 LAB — IRON,TIBC AND FERRITIN PANEL
%SAT: 24 % (ref 16–45)
Ferritin: 10 ng/mL — ABNORMAL LOW (ref 16–232)
Iron: 82 ug/dL (ref 40–190)
TIBC: 336 ug/dL (ref 250–450)

## 2023-08-22 LAB — TSH: TSH: 0.9 m[IU]/L

## 2023-08-22 LAB — HEMOGLOBIN A1C
Hgb A1c MFr Bld: 5.6 %{Hb} (ref <5.7)
Mean Plasma Glucose: 114 mg/dL
eAG (mmol/L): 6.3 mmol/L

## 2023-08-22 LAB — VITAMIN D 25 HYDROXY (VIT D DEFICIENCY, FRACTURES): Vit D, 25-Hydroxy: 126 ng/mL — ABNORMAL HIGH (ref 30–100)

## 2023-09-10 ENCOUNTER — Inpatient Hospital Stay: Admission: RE | Admit: 2023-09-10 | Payer: Medicaid Other | Source: Ambulatory Visit

## 2023-09-10 ENCOUNTER — Other Ambulatory Visit: Payer: Medicaid Other

## 2023-09-11 ENCOUNTER — Encounter: Payer: Self-pay | Admitting: Family Medicine

## 2023-09-11 ENCOUNTER — Telehealth: Payer: Medicaid Other | Admitting: Family Medicine

## 2023-09-11 ENCOUNTER — Ambulatory Visit: Payer: Self-pay | Admitting: Surgery

## 2023-09-11 VITALS — Ht 70.0 in | Wt 165.0 lb

## 2023-09-11 DIAGNOSIS — R0989 Other specified symptoms and signs involving the circulatory and respiratory systems: Secondary | ICD-10-CM | POA: Diagnosis not present

## 2023-09-11 MED ORDER — OSELTAMIVIR PHOSPHATE 75 MG PO CAPS
75.0000 mg | ORAL_CAPSULE | Freq: Two times a day (BID) | ORAL | 0 refills | Status: AC
Start: 2023-09-11 — End: 2023-09-16

## 2023-09-11 NOTE — Patient Instructions (Signed)
Recommend using tylenol [phone] mg every 6 hours as needed, Max dose 4000 mg in 24 hours  You can use other NSAID meds inbetween the tylenol doses for aches/pains and/or fever - ibuprofen, aleve or naproxen (see dose on bottle/box)  Push fluids and rest.  Start tamiflu today and you may be able to do 2 doses today and then complete the 5 days  Influenza, Adult Influenza is also called the flu. It's an infection that affects your respiratory tract. This includes your nose, throat, windpipe, and lungs. The flu is contagious. This means it spreads easily from person to person. It causes symptoms that are like a cold. It can also cause a high fever and body aches. What are the causes? The flu is caused by the influenza virus. You can get it by: Breathing in droplets that are in the air after an infected person coughs or sneezes. Touching something that has the virus on it and then touching your mouth, nose, or eyes. What increases the risk? You may be more likely to get the flu if: You don't wash your hands often. You're near a lot of people during cold and flu season. You touch your mouth, eyes, or nose without washing your hands first. You don't get a flu shot each year. You may also be more at risk for the flu and serious problems, such as a lung infection called pneumonia, if: You're older than 65. You're pregnant. Your immune system is weak. Your immune system is your body's defense system. You have a long-term, or chronic, condition, such as: Heart, kidney, or lung disease. Diabetes. A liver disorder. Asthma. You're very overweight. You have anemia. This is when you don't have enough red blood cells in your body. What are the signs or symptoms? Flu symptoms often start all of a sudden. They may last 4-14 days and include: Fever and chills. Headaches, body aches, or muscle aches. Sore throat. Cough. Runny or stuffy nose. Discomfort in your chest. Not wanting to eat as much  as normal. Feeling weak or tired. Feeling dizzy. Nausea or vomiting. How is this diagnosed? The flu may be diagnosed based on your symptoms and medical history. You may also have a physical exam. A swab may be taken from your nose or throat and tested for the virus. How is this treated? If the flu is found early, you can be treated with antiviral medicine. This may be given to you by mouth or through an IV. It can help you feel less sick and get better faster. Taking care of yourself at home can also help your symptoms get better. Your health care provider may tell you to: Take over-the-counter medicines. Drink lots of fluids. The flu often goes away on its own. If you have very bad symptoms or problems caused by the flu, you may need to be treated in a hospital. Follow these instructions at home: Activity Rest as needed. Get lots of sleep. Stay home from work or school as told by your provider. Leave home only to go see your provider. Do not leave home for other reasons until you don't have a fever for 24 hours without taking medicine. Eating and drinking Take an oral rehydration solution (ORS). This is a drink that is sold at pharmacies and stores. Drink enough fluid to keep your pee pale yellow. Try to drink small amounts of clear fluids. These include water, ice chips, fruit juice mixed with water, and low-calorie sports drinks. Try to eat bland foods  that are easy to digest. These include bananas, applesauce, rice, lean meats, toast, and crackers. Avoid drinks that have a lot of sugar or caffeine in them. These include energy drinks, regular sports drinks, and soda. Do not drink alcohol. Do not eat spicy or fatty foods. General instructions     Take your medicines only as told by your provider. Use a cool mist humidifier to add moisture to the air in your home. This can make it easier for you to breathe. You should also clean the humidifier every day. To do so: Empty the  water. Pour clean water in. Cover your mouth and nose when you cough or sneeze. Wash your hands with soap and water often and for at least 20 seconds. It's extra important to do so after you cough or sneeze. If you can't use soap and water, use hand sanitizer. How is this prevented?  Get a flu shot every year. Ask your provider when you should get your flu shot. Stay away from people who are sick during fall and winter. Fall and winter are cold and flu season. Contact a health care provider if: You get new symptoms. You have chest pain. You have watery poop, also called diarrhea. You have a fever. Your cough gets worse. You start to have more mucus. You feel like you may vomit, or you vomit. Get help right away if: You become short of breath or have trouble breathing. Your skin or nails turn blue. You have very bad pain or stiffness in your neck. You get a sudden headache or pain in your face or ear. You vomit each time you eat or drink. These symptoms may be an emergency. Call 911 right away. Do not wait to see if the symptoms will go away. Do not drive yourself to the hospital. This information is not intended to replace advice given to you by your health care provider. Make sure you discuss any questions you have with your health care provider. Document Revised: 04/19/2023 Document Reviewed: 08/24/2022 Elsevier Patient Education  2024 [employer].

## 2023-09-11 NOTE — Progress Notes (Signed)
Name: Stefanie [name]   MRN: [id number]    DOB: 05/10/1976   Date:09/11/2023       Progress Note  Subjective:    Chief Complaint  Chief Complaint  Patient presents with   Influenza Exposure   Generalized Body Aches    I connected with  Lenward Chancellor  on 09/11/23 at  1:00 PM EST by a video enabled telemedicine application and verified that I am speaking with the correct person using two identifiers.  I discussed the limitations of evaluation and management by telemedicine and the availability of in person appointments. The patient expressed understanding and agreed to proceed. Staff also discussed with the patient that there may be a patient responsible charge related to this service. 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    History of cesarean delivery 09/23/2018   Breast lump in lower-outer quadrant 06/03/2018   Family history of colon cancer 06/03/2018   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   Vitamin D deficiency 03/25/2018   Hypothyroidism 09/24/2017   Umbilical hernia 09/24/2017   Thyroid nodule 05/16/2017   Uterine leiomyoma 03/07/2016    Social History   Tobacco Use   Smoking status: Never   Smokeless tobacco: Never  Substance Use Topics   Alcohol use: No     Current Outpatient Medications:    aspirin 81 MG chewable tablet, Chew 1 tablet (81 mg total) by mouth daily., Disp: , Rfl:    atorvastatin (LIPITOR) 10 MG tablet, TAKE 1/2 TABLET BY MOUTH DAILY, Disp: 45 tablet, Rfl: 3   levothyroxine (SYNTHROID, LEVOTHROID) 100 MCG tablet,  Take 100 mcg by mouth daily before breakfast. , Disp: , Rfl:    Multiple Vitamin (MULTIVITAMIN ADULT PO), Take 1 tablet by mouth daily., Disp: , Rfl:    nitroGLYCERIN (NITROSTAT) 0.4 MG SL tablet, Place 1 tablet (0.4 mg total) under the tongue every 5 (five) minutes x 3 doses as needed for chest pain., Disp: 25 tablet, Rfl: 0   Vitamin D, Ergocalciferol, (DRISDOL) 50000 units CAPS capsule, Take 50,000 Units by mouth once a week. Wednesdays, Disp: , Rfl: 1  Allergies  Allergen Reactions   Fosamax [Alendronate] Other (See Comments)    stomachache   Latex Itching   Reclast [Zoledronic Acid] Rash    I personally reviewed active problem list, medication list, allergies, family history, social history, health maintenance, notes from last encounter, lab results, imaging with the patient/caregiver today.   Review of Systems  Constitutional:  Positive for chills, fatigue and fever. Negative for activity change and appetite change.  HENT: Negative.    Eyes: Negative.   Respiratory: Negative.  Negative for shortness of breath and wheezing.   Cardiovascular: Negative.  Negative for chest pain.  Gastrointestinal: Negative.   Endocrine: Negative.   Genitourinary: Negative.   Musculoskeletal:  Positive for myalgias.  Skin: Negative.   Allergic/Immunologic: Negative.   Neurological: Negative.   Hematological: Negative.   Psychiatric/Behavioral: Negative.    All other systems reviewed and are negative.     Objective:   Virtual encounter, vitals limited,  only able to obtain the following Today's Vitals   09/11/23 1256  Weight: 165 lb (74.8 kg)  Height: 5\' 10"  (1.778 m)   Body mass index is 23.68 kg/m. Nursing Note and Vital Signs reviewed.  Physical Exam Vitals and nursing note reviewed.  Constitutional:      General: She is not in acute distress.    Appearance: She is not ill-appearing, toxic-appearing or diaphoretic.  Pulmonary:     Effort: Pulmonary effort is normal. No  respiratory distress.  Neurological:     Mental Status: She is alert.  Psychiatric:        Mood and Affect: Mood normal.        Behavior: Behavior normal.     PE limited by virtual encounter  No results found for this or any previous visit (from the past 72 hours).  Assessment and Plan:     ICD-10-CM   1. Suspected novel influenza A virus infection  R09.89 oseltamivir (TAMIFLU) 75 MG capsule    Encouraged resting, supportive and symptomatic care for fever/sx Red flags and ER precautions reviewed and pt verbalized understanding   -Red flags and when to present for emergency care or RTC including fever >101.58F, chest pain, shortness of breath, new/worsening/un-resolving symptoms, reviewed with patient at time of visit. Follow up and care instructions discussed and provided in AVS. - I discussed the assessment and treatment plan with the patient. The patient was provided an opportunity to ask questions and all were answered. The patient agreed with the plan and demonstrated an understanding of the instructions.  I provided 15 minutes of non-face-to-face time during this encounter.  Danelle Berry, PA-C 09/11/23 1:08 PM

## 2023-09-26 ENCOUNTER — Ambulatory Visit
Admission: RE | Admit: 2023-09-26 | Discharge: 2023-09-26 | Disposition: A | Discharge status: Home / Self Care | Payer: Medicaid Other | Source: Ambulatory Visit | Attending: Family Medicine | Admitting: Family Medicine

## 2023-09-26 DIAGNOSIS — N6321 Unspecified lump in the left breast, upper outer quadrant: Secondary | ICD-10-CM

## 2023-09-26 DIAGNOSIS — N6315 Unspecified lump in the right breast, overlapping quadrants: Secondary | ICD-10-CM

## 2023-09-26 DIAGNOSIS — R92333 Mammographic heterogeneous density, bilateral breasts: Secondary | ICD-10-CM | POA: Diagnosis not present

## 2023-09-26 DIAGNOSIS — N6001 Solitary cyst of right breast: Secondary | ICD-10-CM | POA: Diagnosis not present

## 2023-09-26 DIAGNOSIS — N6002 Solitary cyst of left breast: Secondary | ICD-10-CM | POA: Diagnosis not present

## 2023-09-26 NOTE — Progress Notes (Signed)
 Patient ID: Stefanie [name], female   DOB: 11-17-1975, 48 y.o.   MRN: [id number]  Chief Complaint: Multiple asymptomatic breast cysts  History of Present Illness Stefanie [name] is a 48 y.o. female with a long well-established/known history of palpable breast lumps consistent with multiple cysts as noted under ultrasound.  She has no family history of breast cancer, only a family history of these breast cysts well-known.  Denies breast tenderness, pain or skin changes.  Screening mammography negative aside from the cysts and correlate well with ultrasound none of the cysts are suspicious in any way.  No significant fibrocystic change, no mastodynia whatsoever.  Caffeine intake is limited to 1 cup daily.  Past Medical History Past Medical History:  Diagnosis Date   Benign neoplasm of thyroid gland    Breast cyst 2014   Iron deficiency anemia    Osteopenia    Spontaneous dissection of coronary artery    Thyroid disease    hypothyroidism   Vitamin D deficiency 03/25/2018      Past Surgical History:  Procedure Laterality Date   BREAST SURGERY     left aspiration    CESAREAN SECTION  07/20/2016   COLONOSCOPY WITH PROPOFOL N/A 08/04/2021   Procedure: COLONOSCOPY WITH PROPOFOL;  Surgeon: Toney Reil, MD;  Location: [facility]OS[facility];  Service: Gastroenterology;  Laterality: N/A;   LEFT HEART CATH AND CORONARY ANGIOGRAPHY N/A 02/09/2020   Procedure: LEFT HEART CATH AND CORONARY ANGIOGRAPHY;  Surgeon: Lyn Records, MD;  Location: [facility];  Service: Cardiovascular;  Laterality: N/A;    Allergies  Allergen Reactions   Fosamax [Alendronate] Other (See Comments)    stomachache   Latex Itching   Reclast [Zoledronic Acid] Rash    Current Outpatient Medications  Medication Sig Dispense Refill   aspirin 81 MG chewable tablet Chew 1 tablet (81 mg total) by mouth daily.     atorvastatin (LIPITOR) 10 MG tablet TAKE 1/2 TABLET BY MOUTH DAILY 45 tablet 3   levothyroxine  (SYNTHROID, LEVOTHROID) 100 MCG tablet Take 100 mcg by mouth daily before breakfast.      Multiple Vitamin (MULTIVITAMIN ADULT PO) Take 1 tablet by mouth daily.     nitroGLYCERIN (NITROSTAT) 0.4 MG SL tablet Place 1 tablet (0.4 mg total) under the tongue every 5 (five) minutes x 3 doses as needed for chest pain. 25 tablet 0   Vitamin D, Ergocalciferol, (DRISDOL) 50000 units CAPS capsule Take 50,000 Units by mouth once a week. Wednesdays  1   No current facility-administered medications for this visit.    Family History Family History  Problem Relation Age of Onset   Cancer - Colon Mother 44   Hyperlipidemia Mother    Hypertension Mother    Diabetes Mellitus II Father    CVA Father 46   Cancer Maternal Grandmother 60   Alzheimer's disease Paternal Grandmother       Social History Social History   Tobacco Use   Smoking status: Never   Smokeless tobacco: Never  Vaping Use   Vaping status: Never Used  Substance Use Topics   Alcohol use: No   Drug use: No       Review of Systems  Constitutional: Negative.   HENT: Negative.    Eyes: Negative.   Respiratory: Negative.    Cardiovascular: Negative.   Gastrointestinal: Negative.   Genitourinary: Negative.   Skin: Negative.   Neurological: Negative.   Psychiatric/Behavioral: Negative.       Physical Exam Blood pressure 139/79, pulse 83,  temperature (!) 97.4 F (36.3 C), temperature source Oral, height 5\' 10"  (1.778 m), weight 159 lb 3.2 oz (72.2 kg), last menstrual period 09/22/2023, SpO2 99%. Last Weight  Most recent update: 09/27/2023  2:43 PM    Weight  72.2 kg (159 lb 3.2 oz)             CONSTITUTIONAL: Well developed, and nourished, appropriately responsive and aware without distress.   EYES: Sclera non-icteric.   EARS, NOSE, MOUTH AND THROAT:  The oropharynx is clear. Oral mucosa is pink and moist.    Hearing is intact to voice.  NECK: Trachea is midline, and there is no jugular venous distension.  LYMPH  NODES:  Lymph nodes in the neck are not appreciated. RESPIRATORY:  Lungs are clear. Normal respiratory effort without pathologic use of accessory muscles. CARDIOVASCULAR: Well perfused.  GI: The abdomen is soft, nontender, and nondistended. There were no palpable masses.  GU: Marylene Land present as chaperone: Breast exam: areas of greatest concern involve the right at 9:00 approximately 5 cm from nipple, 12:00 2 cm from nipple as expected based on ultrasound reports, these are nontender and there are no other suspicious densities or nodularity present..  And on the left at 2:00 at 5 cm from the nipple is the largest mass, still nontender and correlating precisely with ultrasound. MUSCULOSKELETAL:  Symmetrical muscle tone appreciated in all four extremities.    SKIN: Skin turgor is normal. No pathologic skin lesions appreciated.  NEUROLOGIC:  Motor and sensation appear grossly normal.  Cranial nerves are grossly without defect. PSYCH:  Alert and oriented to person, place and time. Affect is appropriate for situation.  Data Reviewed I have personally reviewed what is currently available of the patient's imaging, recent labs and medical records.   Labs:     Latest Ref Rng & Units 08/21/2023   10:55 AM 03/20/2023   11:22 AM 12/01/2022    5:54 AM  CBC  WBC 3.8 - 10.8 Thousand/uL 4.2  7.9  4.8   Hemoglobin 11.7 - 15.5 g/dL 16.1  09.6  04.5   Hematocrit 35.0 - 45.0 % 36.6  36.1  35.3   Platelets 140 - 400 Thousand/uL 305  250  226       Latest Ref Rng & Units 08/21/2023   10:55 AM 03/20/2023   11:22 AM 12/01/2022    5:54 AM  CMP  Glucose 65 - 99 mg/dL 90  409  811   BUN 7 - 25 mg/dL 8  7  10    Creatinine 0.50 - 0.99 mg/dL 9.14  7.82  9.56   Sodium 135 - 146 mmol/L 137  136  138   Potassium 3.5 - 5.3 mmol/L 4.3  4.1  3.7   Chloride 98 - 110 mmol/L 103  102  105   CO2 20 - 32 mmol/L 27  24  24    Calcium 8.6 - 10.2 mg/dL 9.6  9.0  9.0   Total Protein 6.1 - 8.1 g/dL 7.2   6.4   Total Bilirubin 0.2 -  1.2 mg/dL 0.5   0.7   Alkaline Phos 38 - 126 U/L   49   AST 10 - 35 U/L 19   18   ALT 6 - 29 U/L 14   15     Imaging: CLINICAL DATA:  History of bilateral palpable areas of concern, felt by the ordering clinician on routine clinical breast exam. The patient does not note any new palpable masses. Patient has known history of  bilateral cysts.   EXAM: DIGITAL DIAGNOSTIC BILATERAL MAMMOGRAM WITH TOMOSYNTHESIS AND CAD; ULTRASOUND LEFT BREAST LIMITED; ULTRASOUND RIGHT BREAST LIMITED   TECHNIQUE: Bilateral digital diagnostic mammography and breast tomosynthesis was performed. The images were evaluated with computer-aided detection. ; Targeted ultrasound examination of the left breast was performed.; Targeted ultrasound examination of the right breast was performed   COMPARISON:  Previous exam(s).   ACR Breast Density Category c: The breasts are heterogeneously dense, which may obscure small masses.   FINDINGS: Mammogram:   There are fluctuating bilateral round and oval circumscribed masses, most consistent with benign cysts. No new suspicious mass, calcification, or other findings are identified in bilateral breasts.   Ultrasound:   Targeted right breast ultrasound was performed in the indicated areas of palpable concern at 9 o'clock 5 cm from the nipple and 12 o'clock 2 cm from the nipple. This demonstrates numerous adjacent benign simple cysts. The largest cyst at 9 o'clock measures 3.5 x 1.7 x 2.3 cm. The largest cyst at 12 o'clock measures 2.8 x 2.0 x 2.3 cm.   Targeted left breast ultrasound was performed in the indicated area of palpable concern at 2 o'clock 2-5 cm from the nipple. This demonstrates numerous adjacent benign simple cysts. The largest cyst is located at 2 o'clock 5 cm from the nipple and it measures 5.7 x 3.1 x 3.1 cm.   IMPRESSION: 1. Bilateral palpable benign cysts. 2. No evidence of malignancy in bilateral breasts.   RECOMMENDATION: Return to  routine screening mammography is recommended. The patient will be due for screening in February 2026.   I have discussed the findings and recommendations with the patient. If applicable, a reminder letter will be sent to the patient regarding the next appointment.   BI-RADS CATEGORY  2: Benign.     Electronically Signed   By: Jacob Moores M.D.   On: 09/26/2023 15:38 Radiological images reviewed: Example  Within last 24 hrs: No results found.  Assessment    Benign breast cysts, asymptomatic. Patient Active Problem List   Diagnosis Date Noted   Humerus lesion, left 02/12/2020   Unstable angina (HCC) 02/09/2020   Spontaneous dissection of coronary artery    history of nstemi    History of cesarean delivery 09/23/2018   Breast lump in lower-outer quadrant 06/03/2018   Family history of colon cancer 06/03/2018   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   Vitamin D deficiency 03/25/2018   Hypothyroidism 09/24/2017   Umbilical hernia 09/24/2017   Thyroid nodule 05/16/2017   Uterine leiomyoma 03/07/2016    Plan    Would anticipate annual screening examination along with annual screening imaging.  May follow-up as needed.  We discussed the role of additional MRI screening but is likely not justified at this point. Options reviewed and as she remains asymptomatic doesn't have any interest in aspiration or removal.   Face-to-face time spent with the patient and accompanying care providers(if present) was 30 minutes, spent counseling, educating, and coordinating care of the patient.    These notes generated with voice recognition software. I apologize for typographical errors.  Campbell Lerner M.D., FACS 09/28/2023, 11:08 AM

## 2023-09-27 ENCOUNTER — Encounter: Payer: Self-pay | Admitting: Surgery

## 2023-09-27 ENCOUNTER — Ambulatory Visit (INDEPENDENT_AMBULATORY_CARE_PROVIDER_SITE_OTHER): Payer: Medicaid Other | Admitting: Surgery

## 2023-09-27 VITALS — BP 139/79 | HR 83 | Temp 97.4°F | Ht 70.0 in | Wt 159.2 lb

## 2023-09-27 DIAGNOSIS — N6001 Solitary cyst of right breast: Secondary | ICD-10-CM | POA: Diagnosis not present

## 2023-09-27 DIAGNOSIS — N6002 Solitary cyst of left breast: Secondary | ICD-10-CM

## 2023-09-27 NOTE — Patient Instructions (Signed)
 Breast Cyst  A breast cyst is a sac in the breast that is filled with fluid. These cysts are usually noncancerous (benign). Breast cysts are most often in the upper, outer portion of the breast. One or more cysts may develop. They form when fluid builds up inside the breast glands. There are several types of breast cysts. Some are too small to feel but can be seen with imaging tests, such as an X-ray of the breast (mammogram) or ultrasound. Breast cysts do not increase your risk of breast cancer. They usually disappear after you no longer have a menstrual cycle (after menopause), unless you take artificial hormones (are on hormone replacement therapy or menopausal hormone therapy). What are the causes? This condition may be caused by: Blockage of tubes (ducts) in the breast glands, which leads to fluid buildup. Duct blockage may result from: Fibrocystic breast changes. This is a common, benign condition that occurs when women go through hormonal changes during the menstrual cycle. This is a common cause of multiple breast cysts. Overgrowth of breast tissue or breast glands. Scar tissue in the breast from previous surgery. Changes in certain female hormones (estrogen and progesterone). The exact cause of this condition is not known. What increases the risk? You may be more likely to develop breast cysts from 50 years of age until the time of menopause. What are the signs or symptoms? Symptoms of this condition include: Feeling one or more smooth, round, soft lumps (like grapes) in the breast that are easily movable. The lump or lumps may get bigger and more painful before your menstrual period and get smaller after your menstrual period. They may also be painless. Breast discomfort or pain. How is this diagnosed? This condition may be diagnosed based on: A physical exam. Depending on the size of the cyst, your health care provider may be able to feel it during this exam. Imaging tests, such as a  mammogram or ultrasound. Fluid may be removed from the cyst with a needle (fine-needle aspiration) and tested to make sure the cyst is not cancerous. How is this treated? Treatment may not be needed for this condition. Your health care provider may monitor the cyst to see if it goes away on its own. If the cyst is uncomfortable or gets bigger, or if you do not like how the cyst makes your breast look, you may need treatment. Treatment may include: Hormone therapy. Fine-needle aspiration to drain fluid from the cyst. There is a chance of the cyst coming back (recurring) after aspiration. Surgery to remove the cyst. Follow these instructions at home: Self-exams  Talk to your health care provider about breast self-exams. If recommended, do the exams as told. A breast self-exam involves: Comparing your breasts in the mirror. Looking for visible changes in your skin or nipples. Feeling for lumps or changes. Having many breast cysts may make it harder to feel for new lumps. Understand how your breasts normally look and feel, and write down any changes in your breasts. Tell your health care provider about any changes. Eating and drinking Follow instructions from your health care provider about what you may eat and drink. Drink enough fluid to keep your urine pale yellow. Avoid caffeine. Cut down on salt (sodium) in what you eat and drink, especially before your menstrual period. Too much sodium can cause fluid buildup, breast swelling, and discomfort. General instructions See your health care provider regularly. Get a yearly physical exam. If you are 33-8 years old, get a clinical  breast exam every 1-3 years. After the age of 40 years, get this exam every year. Get mammograms as often as directed. Take over-the-counter and prescription medicines only as told by your health care provider. Wear a supportive bra, especially when exercising. Contact a health care provider if: You feel, or think you  feel, a lump in your breast. You notice that both breasts look or feel different than usual. Your breast is still causing pain after your menstrual period is over. You find new lumps or bumps that were not there before. You feel lumps in your armpit. Get help right away if: You have severe pain, tenderness, redness, or warmth in your breast. You have fluid or blood leaking from your nipple. Your breast lump becomes hard and painful. You notice dimpling or wrinkling of the breast or nipple. Summary A breast cyst is a sac in the breast that is filled with fluid. Treatment may not be needed for this condition. If the cyst is uncomfortable or gets bigger, or if you do not like how the cyst makes your breast look, you may need treatment. This information is not intended to replace advice given to you by your health care provider. Make sure you discuss any questions you have with your health care provider. Document Revised: 09/10/2021 Document Reviewed: 09/10/2021 Elsevier Patient Education  2024 [employer].

## 2023-09-28 ENCOUNTER — Other Ambulatory Visit: Payer: Self-pay | Admitting: Family Medicine

## 2023-09-28 ENCOUNTER — Encounter: Payer: Self-pay | Admitting: Family Medicine

## 2023-09-28 DIAGNOSIS — N6001 Solitary cyst of right breast: Secondary | ICD-10-CM | POA: Insufficient documentation

## 2023-09-28 DIAGNOSIS — N6002 Solitary cyst of left breast: Secondary | ICD-10-CM | POA: Insufficient documentation

## 2023-09-28 MED ORDER — LEVOTHYROXINE SODIUM 100 MCG PO TABS: 100.0000 ug | ORAL_TABLET | Freq: Every day | ORAL | 0 refills | Status: DC

## 2023-10-24 ENCOUNTER — Other Ambulatory Visit: Payer: Self-pay | Admitting: Family Medicine

## 2023-11-26 DIAGNOSIS — E039 Hypothyroidism, unspecified: Secondary | ICD-10-CM | POA: Diagnosis not present

## 2023-11-26 DIAGNOSIS — E041 Nontoxic single thyroid nodule: Secondary | ICD-10-CM | POA: Diagnosis not present

## 2023-12-13 ENCOUNTER — Other Ambulatory Visit: Payer: Self-pay | Admitting: Cardiovascular Disease

## 2024-01-11 ENCOUNTER — Ambulatory Visit: Attending: Emergency Medicine | Admitting: Emergency Medicine

## 2024-01-11 ENCOUNTER — Encounter: Payer: Self-pay | Admitting: Emergency Medicine

## 2024-01-11 VITALS — BP 108/70 | HR 84 | Ht 70.0 in | Wt 160.8 lb

## 2024-01-11 DIAGNOSIS — I701 Atherosclerosis of renal artery: Secondary | ICD-10-CM | POA: Diagnosis not present

## 2024-01-11 DIAGNOSIS — E039 Hypothyroidism, unspecified: Secondary | ICD-10-CM | POA: Diagnosis not present

## 2024-01-11 DIAGNOSIS — E785 Hyperlipidemia, unspecified: Secondary | ICD-10-CM | POA: Insufficient documentation

## 2024-01-11 DIAGNOSIS — I2542 Coronary artery dissection: Secondary | ICD-10-CM | POA: Insufficient documentation

## 2024-01-11 MED ORDER — ATORVASTATIN CALCIUM 10 MG PO TABS: 5.0000 mg | ORAL_TABLET | Freq: Every day | ORAL | 3 refills | Status: AC

## 2024-01-11 NOTE — Patient Instructions (Signed)
 Medication Instructions:  NO CHANGES  Lab Work: NONE  Testing/Procedures: NONE  Follow-Up: At [facility], you and your health needs are our priority.  As part of our continuing mission to provide you with exceptional heart care, our providers are all part of one team.  This team includes your primary Cardiologist (physician) and Advanced Practice Providers or APPs (Physician Assistants and Nurse Practitioners) who all work together to provide you with the care you need, when you need it.  Your next appointment:   1 YEAR  Provider:   MADISON FOUNTAIN, DNP

## 2024-01-11 NOTE — Progress Notes (Unsigned)
 Cardiology Office Note:    Date:  01/11/2024  ID:  Stefanie [name], DOB [birth year], MRN [id number] PCP: Arleen Lacer, MD  [facility] Providers Cardiologist:  Janelle Mediate, MD { Click to update primary MD,subspecialty MD or APP then REFRESH:1}    {Click to Open Review  :1}   Patient Profile:       Chief Complaint: 1 year follow-up History of Present Illness:  Stefanie [name] is a 48 y.o. female with visit-pertinent history of STEMI 02/09/2020 with cardiac catheterization showing type II spontaneous coronary artery dissection   She was admitted 02/09/2020 with STEMI.  Cardiac catheterization showed type II spontaneous coronary artery dissection of first large OM with segmental 50% irregular narrowing but TIMI-3 flow.  Otherwise had normal left main, LAD, LCx, and RCA along with normal LV function.  He was recommended to be on beta-blocker therapy and blood pressure control to less than 120/80.  She underwent renal artery ultrasound on 09/14/2022 showing no evidence of renal artery stenosis.  She was last seen in clinic on 12/08/2022.  Her Plavix  and metoprolol  XL were discontinued.  She underwent Lexiscan Myoview  on 12/13/2022 showing normal low risk study.   Discussed the use of AI scribe software for clinical note transcription with the patient, who gave verbal consent to proceed.  Today patient is doing well overall.  She has had no acute cardiovascular concerns or complaints over the past year.  She tells me her palpitations/irregular heartbeats have entirely resolved.  She has no chest pains or anginal symptoms.  Denies any dyspnea, orthopnea, PND, leg swelling.  She works as Management consultant.  She exercises regularly, going to the gym daily for about an hour.  She has resumed exercising recently and is focused on maintaining her health.  Overall she feels like her health is good.   Review of systems:  Please see the history of present illness. All other systems are  reviewed and otherwise negative.      Studies Reviewed:    EKG Interpretation Date/Time:  Friday January 11 2024 10:29:25 EDT Ventricular Rate:  85 PR Interval:  160 QRS Duration:  92 QT Interval:  334 QTC Calculation: 397 R Axis:   51  Text Interpretation: Normal sinus rhythm Normal ECG When compared with ECG of 01-Dec-2022 05:44, Premature ventricular complexes are no longer Present Confirmed by Palmer Bobo [phone]) on 01/11/2024 12:39:14 PM   Lexiscan Myoview  12/13/2022   The study is normal. The study is low risk.   No ST deviation was noted.   Left ventricular function is normal. Nuclear stress EF: 55 %. The left ventricular ejection fraction is normal (55-65%). End diastolic cavity size is normal.  Renal artery ultrasound 09/14/2022 Right: Normal size right kidney. Normal right Resisitive Index.         Normal cortical thickness of right kidney. No evidence of         right renal artery stenosis. RRV flow present.  Left:  Normal size of left kidney. Normal left Resistive Index.         Normal cortical thickness of the left kidney. No evidence of         left renal artery stenosis. LRV flow present.   Cardiac catheterizations 02/09/2020 Type II spontaneous coronary artery dissection of the large first obtuse marginal with segmental 50% irregular narrowing but maintained TIMI grade III flow.  Patient is asymptomatic at the time of cath. Normal left main Normal LAD Otherwise normal circumflex Dominant normal right coronary artery Normal  LV systolic function and end-diastolic pressure.   RECOMMENDATIONS:   Beta-blocker therapy and blood pressure control to less than 120/80 mmHg.  Renin angiotensin blockade or calcium  channel blocker therapy would be considerations in addition to beta-blocker if needed. Aspirin  and Plavix  for least 6 months. Statin therapy Avoid full anticoagulation. Consider screening carotid and renal arteries for evidence of fibromuscular dysplasia We  will check hs-CRP and sed rate. Diagnostic Dominance: Right  Risk Assessment/Calculations:   {Does this patient have ATRIAL FIBRILLATION?[phone]}          Physical Exam:   VS:  BP 108/70   Pulse 84   Ht 5' 10 (1.778 m)   Wt 160 lb 12.8 oz (72.9 kg)   SpO2 99%   BMI 23.07 kg/m    Wt Readings from Last 3 Encounters:  01/11/24 160 lb 12.8 oz (72.9 kg)  09/27/23 159 lb 3.2 oz (72.2 kg)  09/11/23 165 lb (74.8 kg)    GEN: Well nourished, well developed in no acute distress NECK: No JVD; No carotid bruits CARDIAC: RRR, no murmurs, rubs, gallops RESPIRATORY:  Clear to auscultation without rales, wheezing or rhonchi  ABDOMEN: Soft, non-tender, non-distended EXTREMITIES:  No edema; No acute deformity      Assessment and Plan:  Spontaneous dissection of coronary artery Cardiac catheterization 01/2020 showed type II Smart DES coronary artery dissection of the large first obtuse marginal with 50% irregular narrowing but maintained TIMI grade III flow Echocardiogram 01/2020 with LVEF 55 to 60% Lexiscan Myoview  11/2022 was low risk without ischemia No FMD noted on renal US  EKG today without acute ischemic changes - Today patient is without any anginal symptoms, no indication for further ischemic evaluation at this time.  Maintaining active lifestyle without exertional symptoms - Continue aspirin  81 mg daily and atorvastatin  5 mg daily - Blood pressure and cholesterol are both under excellent control  Hyperlipidemia LDL 70 on 08/2023 and well-controlled - Continue torsemide 5 mg daily  Renal artery stenosis No FMD noted Renal US  8/21 showed 1-59% stenosis to left renal artery and normal right renal artery Repeat renal artery US  08/2022 showed no evidence of RAS - Can consider repeat in 1 year for routine surveillance - Blood pressure well-controlled today with normal creatinine  Hypothyroidism TSH is 0.9 on 08/2023 - Managed on Synthroid  100 mcg per PCP      Dispo:  Return in  about 1 year (around 01/10/2025).  Signed, Ava Boatman, NP

## 2024-01-12 ENCOUNTER — Encounter: Payer: Self-pay | Admitting: Emergency Medicine

## 2024-01-23 DIAGNOSIS — E041 Nontoxic single thyroid nodule: Secondary | ICD-10-CM | POA: Diagnosis not present

## 2024-02-18 ENCOUNTER — Ambulatory Visit (INDEPENDENT_AMBULATORY_CARE_PROVIDER_SITE_OTHER): Payer: Self-pay | Admitting: Family Medicine

## 2024-02-18 ENCOUNTER — Encounter: Payer: Self-pay | Admitting: Family Medicine

## 2024-02-18 VITALS — BP 110/62 | HR 87 | Resp 16 | Ht 70.0 in | Wt 160.4 lb

## 2024-02-18 DIAGNOSIS — N951 Menopausal and female climacteric states: Secondary | ICD-10-CM | POA: Insufficient documentation

## 2024-02-18 DIAGNOSIS — N921 Excessive and frequent menstruation with irregular cycle: Secondary | ICD-10-CM | POA: Diagnosis not present

## 2024-02-18 DIAGNOSIS — R7303 Prediabetes: Secondary | ICD-10-CM | POA: Insufficient documentation

## 2024-02-18 DIAGNOSIS — E041 Nontoxic single thyroid nodule: Secondary | ICD-10-CM

## 2024-02-18 DIAGNOSIS — E039 Hypothyroidism, unspecified: Secondary | ICD-10-CM

## 2024-02-18 DIAGNOSIS — M8580 Other specified disorders of bone density and structure, unspecified site: Secondary | ICD-10-CM | POA: Diagnosis not present

## 2024-02-18 DIAGNOSIS — D500 Iron deficiency anemia secondary to blood loss (chronic): Secondary | ICD-10-CM

## 2024-02-18 DIAGNOSIS — E611 Iron deficiency: Secondary | ICD-10-CM | POA: Insufficient documentation

## 2024-02-18 LAB — IRON,TIBC AND FERRITIN PANEL
%SAT: 28 % (ref 16–45)
Ferritin: 12 ng/mL — ABNORMAL LOW (ref 16–232)
Iron: 96 ug/dL (ref 45–160)
TIBC: 341 ug/dL (ref 250–450)

## 2024-02-18 LAB — CBC WITH DIFFERENTIAL/PLATELET
Absolute Lymphocytes: 1415 {cells}/uL (ref 850–3900)
Absolute Monocytes: 340 {cells}/uL (ref 200–950)
Basophils Absolute: 22 {cells}/uL (ref 0–200)
Basophils Relative: 0.5 %
Eosinophils Absolute: 60 {cells}/uL (ref 15–500)
Eosinophils Relative: 1.4 %
HCT: 39.7 % (ref 35.0–45.0)
Hemoglobin: 12.6 g/dL (ref 11.7–15.5)
MCH: 29.7 pg (ref 27.0–33.0)
MCHC: 31.7 g/dL — ABNORMAL LOW (ref 32.0–36.0)
MCV: 93.6 fL (ref 80.0–100.0)
MPV: 11.8 fL (ref 7.5–12.5)
Monocytes Relative: 7.9 %
Neutro Abs: 2464 {cells}/uL (ref 1500–7800)
Neutrophils Relative %: 57.3 %
Platelets: 225 Thousand/uL (ref 140–400)
RBC: 4.24 Million/uL (ref 3.80–5.10)
RDW: 11.7 % (ref 11.0–15.0)
Total Lymphocyte: 32.9 %
WBC: 4.3 Thousand/uL (ref 3.8–10.8)

## 2024-02-18 LAB — TSH: TSH: 0.17 m[IU]/L — ABNORMAL LOW

## 2024-02-18 LAB — VITAMIN D 25 HYDROXY (VIT D DEFICIENCY, FRACTURES): Vit D, 25-Hydroxy: 96 ng/mL (ref 30–100)

## 2024-02-18 NOTE — Progress Notes (Signed)
 Name: Stefanie [name]   MRN: [id number]    DOB: Aug 06, 1975   Date:02/18/2024       Progress Note  Subjective  Chief Complaint  Chief Complaint  Patient presents with   Medical Management of Chronic Issues   Menstrual Problem    Discus perimenopause and getting referral to gyn for RHT   Discussed the use of AI scribe software for clinical note transcription with the patient, who gave verbal consent to proceed.  History of Present Illness Stefanie [name] is a 48 year old female who presents with perimenopausal symptoms.  She is experiencing significant perimenopausal symptoms, including hot flashes, clammy skin, vaginal dryness, and irregular menstrual cycles. Her cycles have become heavier and less regular, with only one skipped cycle this year. The heavy bleeding is described as 'really ridiculously heavy,' affecting her quality of life and causing fear of leaving the house during her period.  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 A cardiac catheterization at that time showed irregular narrowing but good flow, and an echocardiogram in May 2024 showed no ischemia. She continues to take aspirin  and atorvastatin  5 mg (half of a 10 mg tablet) daily. Her blood pressure is well-controlled without medication, currently at 110/62 mmHg.  She has hypothyroidism and takes Synthroid  100 mcg daily. She has a thyroid  nodule that is monitored regularly, with a recent ultrasound showing two right side nodules. No changes in bowel movements or swallowing difficulties.  She has iron deficiency anemia, likely due to heavy menstrual bleeding, and takes iron supplements regularly. Her last iron storage level was low at 13. She also takes calcium  and vitamin D  supplements, though she has stopped high-dose vitamin D  due to previously high levels.  Her family history includes her husband, who was recently diagnosed with Parkinson's disease at age 30. He is currently  managing well with medication and continues to work.    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   Vitamin D  deficiency 03/25/2018   Hypothyroidism 09/24/2017   Umbilical hernia 09/24/2017   Thyroid  nodule 05/16/2017   Uterine leiomyoma 03/07/2016    Past Surgical History:  Procedure Laterality Date   BREAST SURGERY     left aspiration    CESAREAN SECTION  07/20/2016   COLONOSCOPY WITH PROPOFOL  N/A 08/04/2021   Procedure: COLONOSCOPY WITH PROPOFOL ;  Surgeon: Unk Corinn Skiff, MD;  Location: [facility];  Service: Gastroenterology;  Laterality: N/A;   LEFT HEART CATH AND CORONARY ANGIOGRAPHY N/A 02/09/2020   Procedure: LEFT HEART CATH AND CORONARY ANGIOGRAPHY;  Surgeon: Claudene Victory ORN, MD;  Location: [facility];  Service: Cardiovascular;  Laterality: N/A;    Family History  Problem Relation Age of Onset   Cancer - Colon Mother 53   Hyperlipidemia Mother    Hypertension Mother    Diabetes Mellitus II Father    CVA Father 10   Cancer Maternal Grandmother 63   Alzheimer's disease Paternal Grandmother     Social History   Tobacco Use   Smoking status: Never   Smokeless tobacco: Never  Substance Use Topics   Alcohol  use: No     Current Outpatient Medications:    ascorbic acid (VITAMIN C) 500 MG tablet, Take 1,000 mg by mouth., Disp: , Rfl:    aspirin  81 MG chewable tablet, Chew 1 tablet (81 mg total) by mouth daily., Disp: , Rfl:    atorvastatin  (LIPITOR ) 10 MG tablet, Take 0.5 tablets (5 mg total) by mouth daily., Disp: 45 tablet, Rfl: 3   ferrous sulfate  325 (65 FE) MG EC tablet, Take 325 mg by  mouth., Disp: , Rfl:    Multiple Vitamin (MULTIVITAMIN ADULT PO), Take 1 tablet by mouth daily., Disp: , Rfl:    nitroGLYCERIN  (NITROSTAT ) 0.4 MG SL tablet, Place 1 tablet (0.4 mg total) under the tongue every 5 (five) minutes x 3 doses as needed for chest pain., Disp: 25 tablet, Rfl: 0   SYNTHROID  100 MCG tablet, TAKE 1 TABLET BY MOUTH EVERY DAY BEFORE BREAKFAST, Disp: 90 tablet, Rfl: 1   Vitamin D , Ergocalciferol , (DRISDOL) 50000 units CAPS capsule, Take 50,000 Units by mouth once a week. Wednesdays (Patient not taking: Reported on 02/18/2024), Disp: , Rfl: 1  Allergies  Allergen Reactions   Fosamax [Alendronate] Other (See Comments)    stomachache   Latex Itching   Reclast [Zoledronic Acid] Rash    I personally reviewed active problem list, medication list, allergies, family history with the patient/caregiver today.   ROS  Ten systems reviewed and is negative except as mentioned in HPI    Objective Physical Exam  CONSTITUTIONAL: Patient appears well-developed and well-nourished.  No distress. HEENT: Head atraumatic, normocephalic, neck supple. CARDIOVASCULAR: Normal rate, regular rhythm and normal heart sounds.  No murmur heard. No BLE edema. PULMONARY: Effort normal and breath sounds normal. No respiratory distress. ABDOMINAL: There is no tenderness or distention. MUSCULOSKELETAL: Normal gait. Without gross motor or sensory deficit. PSYCHIATRIC: Patient has a normal mood and affect. behavior is normal. Judgment and thought content normal.  Vitals:   02/18/24 0956  BP: 110/62  Pulse: 87  Resp: 16  SpO2: 98%  Weight: 160 lb 6.4 oz (72.8 kg)  Height: 5' 10 (1.778 m)    Body mass index is 23.02 kg/m.    PHQ2/9:    02/18/2024   10:04 AM 08/21/2023    9:59 AM 06/26/2023    9:13 AM 12/06/2022    1:46 PM 08/17/2022    9:27 AM  Depression screen PHQ 2/9  Decreased Interest 0 0 0 0 0  Down, Depressed, Hopeless 0 0 0 1 0  PHQ - 2 Score 0 0 0 1 0  Altered sleeping 0 0  3  1  Tired, decreased energy 0 0  3 3  Change in appetite 0 0  1 0  Feeling bad or failure about yourself  0 0  0 0  Trouble concentrating 0 0  3 0  Moving slowly or fidgety/restless 0 0  0 0  Suicidal thoughts 0 0  0 0  PHQ-9 Score 0 0  11 4  Difficult doing work/chores Not difficult at all Not difficult at all  Somewhat difficult     phq 9 is negative  Fall Risk:    02/18/2024    9:57 AM 08/21/2023    9:53 AM 06/26/2023    9:13 AM 12/06/2022    1:46 PM 08/17/2022    9:27 AM  Fall Risk   Falls in the past year? 0 0 0 0 0  Number falls in past yr: 0 0 0  0  Injury with Fall?  0 0 0  0  Risk for fall due to :  No Fall Risks No Fall Risks No Fall Risks No Fall Risks  Follow up Falls evaluation completed Falls prevention discussed;Education provided;Falls evaluation completed Falls prevention discussed;Education provided;Falls evaluation completed Falls prevention discussed Falls prevention discussed      Data saved with a previous flowsheet row definition      Assessment & Plan Perimenopausal symptoms with heavy menstrual bleeding and iron deficiency anemia Severe perimenopausal symptoms affecting quality of life. Hormonal replacement therapy considered despite cardiac history. - Refer to gynecologist for hormonal replacement therapy evaluation. - Check iron levels. - Continue iron supplementation.  Hypothyroidism with right thyroid  nodule Hypothyroidism with two right thyroid  nodules. Fine needle aspiration may be needed. - under the care of Endo - Order thyroid  function tests. - Review thyroid  ultrasound and consider fine needle aspiration. - Continue Synthroid  100 mcg.  Osteopenia Osteopenia with previous adverse reaction to Reclast. Hormonal replacement therapy may benefit bone health. - Schedule bone density scan. - Continue calcium  and vitamin D  supplementation.  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 On  aspirin  and atorvastatin  with goal LDL below 70. Blood pressure well-controlled. - Continue aspirin  and atorvastatin . - Monitor blood pressure and lipid levels. - Continue follow-up with cardiologist.  General Health Maintenance Up to date with colonoscopy and cervical cancer screening. Vitamin D  levels previously high. - Check vitamin D  levels. - Consider pneumonia vaccination.

## 2024-02-19 ENCOUNTER — Ambulatory Visit: Payer: Self-pay | Admitting: Family Medicine

## 2024-02-28 DIAGNOSIS — E890 Postprocedural hypothyroidism: Secondary | ICD-10-CM | POA: Diagnosis not present

## 2024-02-28 DIAGNOSIS — E041 Nontoxic single thyroid nodule: Secondary | ICD-10-CM | POA: Diagnosis not present

## 2024-02-28 DIAGNOSIS — Z1331 Encounter for screening for depression: Secondary | ICD-10-CM | POA: Diagnosis not present

## 2024-04-23 DIAGNOSIS — E890 Postprocedural hypothyroidism: Secondary | ICD-10-CM | POA: Diagnosis not present

## 2024-04-30 DIAGNOSIS — E042 Nontoxic multinodular goiter: Secondary | ICD-10-CM | POA: Diagnosis not present

## 2024-04-30 DIAGNOSIS — E890 Postprocedural hypothyroidism: Secondary | ICD-10-CM | POA: Diagnosis not present

## 2024-05-12 ENCOUNTER — Ambulatory Visit (INDEPENDENT_AMBULATORY_CARE_PROVIDER_SITE_OTHER)

## 2024-05-12 DIAGNOSIS — Z23 Encounter for immunization: Secondary | ICD-10-CM | POA: Diagnosis not present

## 2024-05-19 DIAGNOSIS — N951 Menopausal and female climacteric states: Secondary | ICD-10-CM | POA: Diagnosis not present

## 2024-08-20 NOTE — Patient Instructions (Signed)
 Preventive Care 51-51 Years Old, Female  Preventive care refers to lifestyle choices and visits with your health care provider that can promote health and wellness. Preventive care visits are also called wellness exams.  What can I expect for my preventive care visit?  Counseling  Your health care provider may ask you questions about your:  Medical history, including:  Past medical problems.  Family medical history.  Pregnancy history.  Current health, including:  Menstrual cycle.  Method of birth control.  Emotional well-being.  Home life and relationship well-being.  Sexual activity and sexual health.  Lifestyle, including:  Alcohol, nicotine or tobacco, and drug use.  Access to firearms.  Diet, exercise, and sleep habits.  Work and work Astronomer.  Sunscreen use.  Safety issues such as seatbelt and bike helmet use.  Physical exam  Your health care provider will check your:  Height and weight. These may be used to calculate your BMI (body mass index). BMI is a measurement that tells if you are at a healthy weight.  Waist circumference. This measures the distance around your waistline. This measurement also tells if you are at a healthy weight and may help predict your risk of certain diseases, such as type 2 diabetes and high blood pressure.  Heart rate and blood pressure.  Body temperature.  Skin for abnormal spots.  What immunizations do I need?    Vaccines are usually given at various ages, according to a schedule. Your health care provider will recommend vaccines for you based on your age, medical history, and lifestyle or other factors, such as travel or where you work.  What tests do I need?  Screening  Your health care provider may recommend screening tests for certain conditions. This may include:  Lipid and cholesterol levels.  Diabetes screening. This is done by checking your blood sugar (glucose) after you have not eaten for a while (fasting).  Pelvic exam and Pap test.  Hepatitis B test.  Hepatitis C  test.  HIV (human immunodeficiency virus) test.  STI (sexually transmitted infection) testing, if you are at risk.  Lung cancer screening.  Colorectal cancer screening.  Mammogram. Talk with your health care provider about when you should start having regular mammograms. This may depend on whether you have a family history of breast cancer.  BRCA-related cancer screening. This may be done if you have a family history of breast, ovarian, tubal, or peritoneal cancers.  Bone density scan. This is done to screen for osteoporosis.  Talk with your health care provider about your test results, treatment options, and if necessary, the need for more tests.  Follow these instructions at home:  Eating and drinking    Eat a diet that includes fresh fruits and vegetables, whole grains, lean protein, and low-fat dairy products.  Take vitamin and mineral supplements as recommended by your health care provider.  Do not drink alcohol if:  Your health care provider tells you not to drink.  You are pregnant, may be pregnant, or are planning to become pregnant.  If you drink alcohol:  Limit how much you have to 0-1 drink a day.  Know how much alcohol is in your drink. In the [place] one drink equals one 12 oz bottle of beer (355 mL), one 5 oz glass of wine (148 mL), or one 1 oz glass of hard liquor (44 mL).  Lifestyle  Brush your teeth every morning and night with fluoride toothpaste. Floss one time each day.  Exercise for at least  30 minutes 5 or more days each week.  Do not use any products that contain nicotine or tobacco. These products include cigarettes, chewing tobacco, and vaping devices, such as e-cigarettes. If you need help quitting, ask your health care provider.  Do not use drugs.  If you are sexually active, practice safe sex. Use a condom or other form of protection to prevent STIs.  If you do not wish to become pregnant, use a form of birth control. If you plan to become pregnant, see your health care provider for a  prepregnancy visit.  Take aspirin only as told by your health care provider. Make sure that you understand how much to take and what form to take. Work with your health care provider to find out whether it is safe and beneficial for you to take aspirin daily.  Find healthy ways to manage stress, such as:  Meditation, yoga, or listening to music.  Journaling.  Talking to a trusted person.  Spending time with friends and family.  Minimize exposure to UV radiation to reduce your risk of skin cancer.  Safety  Always wear your seat belt while driving or riding in a vehicle.  Do not drive:  If you have been drinking alcohol. Do not ride with someone who has been drinking.  When you are tired or distracted.  While texting.  If you have been using any mind-altering substances or drugs.  Wear a helmet and other protective equipment during sports activities.  If you have firearms in your house, make sure you follow all gun safety procedures.  Seek help if you have been physically or sexually abused.  What's next?  Visit your health care provider once a year for an annual wellness visit.  Ask your health care provider how often you should have your eyes and teeth checked.  Stay up to date on all vaccines.  This information is not intended to replace advice given to you by your health care provider. Make sure you discuss any questions you have with your health care provider.  Document Revised: 01/12/2021 Document Reviewed: 01/12/2021  Elsevier Patient Education  2024 [employer].

## 2024-08-22 ENCOUNTER — Encounter: Payer: Self-pay | Admitting: Family Medicine

## 2024-08-22 ENCOUNTER — Ambulatory Visit (INDEPENDENT_AMBULATORY_CARE_PROVIDER_SITE_OTHER): Payer: Self-pay | Admitting: Family Medicine

## 2024-08-22 VITALS — BP 116/68 | HR 69 | Resp 16 | Ht 70.0 in | Wt 166.3 lb

## 2024-08-22 DIAGNOSIS — E039 Hypothyroidism, unspecified: Secondary | ICD-10-CM | POA: Diagnosis not present

## 2024-08-22 DIAGNOSIS — Z862 Personal history of diseases of the blood and blood-forming organs and certain disorders involving the immune mechanism: Secondary | ICD-10-CM | POA: Diagnosis not present

## 2024-08-22 DIAGNOSIS — D500 Iron deficiency anemia secondary to blood loss (chronic): Secondary | ICD-10-CM | POA: Diagnosis not present

## 2024-08-22 DIAGNOSIS — E559 Vitamin D deficiency, unspecified: Secondary | ICD-10-CM

## 2024-08-22 DIAGNOSIS — Z0000 Encounter for general adult medical examination without abnormal findings: Secondary | ICD-10-CM

## 2024-08-22 DIAGNOSIS — Z13220 Encounter for screening for lipoid disorders: Secondary | ICD-10-CM

## 2024-08-22 DIAGNOSIS — R7303 Prediabetes: Secondary | ICD-10-CM

## 2024-08-22 DIAGNOSIS — Z1231 Encounter for screening mammogram for malignant neoplasm of breast: Secondary | ICD-10-CM | POA: Diagnosis not present

## 2024-08-22 DIAGNOSIS — N63 Unspecified lump in unspecified breast: Secondary | ICD-10-CM | POA: Diagnosis not present

## 2024-08-22 NOTE — Progress Notes (Signed)
 Name: Stefanie [name]   MRN: [id number]    DOB: 1976/03/11   Date:08/22/2024       Progress Note  Subjective  Chief Complaint  Chief Complaint  Patient presents with   Medical Management of Chronic Issues    HPI  Patient presents for annual CPE.  Diet: high protein diet  Exercise: continue regular activity   Last Eye Exam: completed Last Dental Exam: completed  Flowsheet Row Office Visit from 08/22/2024 in [facility]  AUDIT-C Score 0   Depression: Phq 9 is  negative    08/22/2024    9:55 AM 02/18/2024   10:04 AM 08/21/2023    9:59 AM 06/26/2023    9:13 AM 12/06/2022    1:46 PM  Depression screen PHQ 2/9  Decreased Interest 0 0 0 0 0  Down, Depressed, Hopeless 0 0 0 0 1  PHQ - 2 Score 0 0 0 0 1  Altered sleeping  0 0  3  Tired, decreased energy  0 0  3  Change in appetite  0 0  1  Feeling bad or failure about yourself   0 0  0  Trouble concentrating  0 0  3  Moving slowly or fidgety/restless  0 0  0  Suicidal thoughts  0 0  0  PHQ-9 Score  0  0   11   Difficult doing work/chores  Not difficult at all Not difficult at all  Somewhat difficult     Data saved with a previous flowsheet row definition   Hypertension: BP Readings from Last 3 Encounters:  08/22/24 116/68  02/18/24 110/62  01/11/24 108/70   Obesity: Wt Readings from Last 3 Encounters:  08/22/24 166 lb 4.8 oz (75.4 kg)  02/18/24 160 lb 6.4 oz (72.8 kg)  01/11/24 160 lb 12.8 oz (72.9 kg)   BMI Readings from Last 3 Encounters:  08/22/24 23.86 kg/m  02/18/24 23.02 kg/m  01/11/24 23.07 kg/m     Vaccines: reviewed with the patient.   Hep C Screening: completed STD testing and prevention (HIV/chl/gon/syphilis): N/A Intimate partner violence: negative screen  Sexual History : no pain since HRT  Menstrual History/LMP/Abnormal Bleeding: started HRT October 2025 and since than cycles are lighter but irregular due to break through bleeding but feeling better, no longer has hot  flashes. LMP 08/20/2024. , improvement in brain fog, hot flashes and night sweats. Also falls asleep better but still not staying asleep  Discussed importance of follow up if any post-menopausal bleeding: not applicable  Incontinence Symptoms: negative for symptoms   Breast cancer:  - Last Mammogram: she will schedule it  - BRCA gene screening: N/A  Osteoporosis Prevention : Discussed high calcium  and vitamin D  supplementation, weight bearing exercises Bone density :yes   Cervical cancer screening: up-to-date  Skin cancer: Discussed monitoring for atypical lesions  Colorectal cancer: repeat in 2028   Lung cancer:  Low Dose CT Chest recommended if Age 59-80 years, 20 pack-year currently smoking OR have quit w/in 15years. Patient does not qualify for screen   ECG: under the care of cardiologist   Advanced Care Planning: A voluntary discussion about advance care planning including the explanation and discussion of advance directives.  Discussed health care proxy and Living will, and the patient was able to identify a health care proxy as husband .  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   Vitamin D  deficiency 03/25/2018   Hypothyroidism 09/24/2017   Umbilical hernia 09/24/2017   Thyroid  nodule 05/16/2017   Uterine leiomyoma 03/07/2016    Past Surgical History:  Procedure Laterality Date   BREAST  SURGERY     left aspiration    CESAREAN SECTION  07/20/2016   COLONOSCOPY WITH PROPOFOL  N/A 08/04/2021   Procedure: COLONOSCOPY WITH PROPOFOL ;  Surgeon: Unk Corinn Skiff, MD;  Location: [facility] ENDOSCOPY;  Service: Gastroenterology;  Laterality: N/A;   LEFT HEART CATH AND CORONARY ANGIOGRAPHY N/A 02/09/2020   Procedure: LEFT HEART CATH AND CORONARY ANGIOGRAPHY;  Surgeon: Claudene Victory ORN, MD;  Location: [facility] INVASIVE CV LAB;  Service: Cardiovascular;  Laterality: N/A;    Family History  Problem Relation Age of Onset   Cancer - Colon Mother 23   Hyperlipidemia Mother    Hypertension Mother    Cancer Mother    Diabetes Mellitus II Father    CVA Father 47   Diabetes Father    Cancer Maternal Grandmother 6   Alzheimer's disease Paternal Grandmother     Social History   Socioeconomic History   Marital status: Married    Spouse name: Sullivan Blasing   Number of children: 1   Years of education: 14   Highest education level: Bachelor's degree (e.g., BA, AB, BS)  Occupational History    Comment: prn for health unit coordinator   Tobacco Use   Smoking status: Never   Smokeless tobacco: Never  Vaping Use   Vaping status: Never Used  Substance and Sexual Activity   Alcohol use: Never   Drug use: Never   Sexual activity: Yes    Partners: Male    Birth control/protection: Condom, None  Other Topics Concern   Not on file  Social History Narrative   Married since 2015, they have one child born 07/20/2016   Works part time at [employer]   Tobacco Use: Low Risk (08/22/2024)   Patient History    Smoking Tobacco Use: Never    Smokeless Tobacco Use: Never    Passive Exposure: Not on file  Financial Resource Strain: Low Risk (08/21/2024)   Overall Financial Resource Strain (CARDIA)    Difficulty of Paying Living Expenses: Not hard at all  Food Insecurity: No Food Insecurity (08/21/2024)   Epic    Worried About Radiation Protection Practitioner of Food in the Last Year: Never true     Ran Out of Food in the Last Year: Never true  Transportation Needs: No Transportation Needs (08/21/2024)   Epic    Lack of Transportation (Medical): No    Lack of Transportation (Non-Medical): No  Physical Activity: Sufficiently Active (08/21/2024)   Exercise Vital Sign    Days of Exercise per Week: 4 days    Minutes of Exercise per Session: 60 min  Stress: No Stress Concern Present (08/21/2024)   [employer] of Occupational Health - Occupational Stress Questionnaire    Feeling of Stress: Not at all  Social Connections:  Socially Integrated (08/21/2024)   Social Connection and Isolation Panel    Frequency of Communication with Friends and Family: More than three times a week    Frequency of Social Gatherings with Friends and Family: Once a week    Attends Religious Services: More than 4 times per year    Active Member of Clubs or Organizations: Yes    Attends Banker Meetings: More than 4 times per year    Marital Status: Married  Catering Manager Violence: Not At Risk (08/22/2024)   Epic    Fear of Current or Ex-Partner: No    Emotionally Abused: No    Physically Abused: No    Sexually Abused: No  Depression (PHQ2-9): Low Risk (08/22/2024)   Depression (PHQ2-9)    PHQ-2 Score: 0  Alcohol Screen: Low Risk (08/22/2024)   Alcohol Screen    Last Alcohol Screening Score (AUDIT): 0  Housing: Low Risk (08/21/2024)   Epic    Unable to Pay for Housing in the Last Year: No    Number of Times Moved in the Last Year: 0    Homeless in the Last Year: No  Utilities: Not At Risk (08/22/2024)   Epic    Threatened with loss of utilities: No  Health Literacy: Adequate Health Literacy (08/22/2024)   B1300 Health Literacy    Frequency of need for help with medical instructions: Never    Current Medications[1]  Allergies[2]   ROS  Constitutional: Negative for fever or weight change.  Respiratory: Negative for cough and shortness of breath.   Cardiovascular: Negative for chest  pain or palpitations.  Gastrointestinal: Negative for abdominal pain, no bowel changes.  Musculoskeletal: Negative for gait problem or joint swelling.  Skin: Negative for rash.  Neurological: Negative for dizziness or headache.  No other specific complaints in a complete review of systems (except as listed in HPI above).   Objective  Vitals:   08/22/24 0959  BP: 116/68  Pulse: 69  Resp: 16  SpO2: 98%  Weight: 166 lb 4.8 oz (75.4 kg)  Height: 5' 10 (1.778 m)    Body mass index is 23.86 kg/m.  Physical Exam  Constitutional: Patient appears well-developed and well-nourished. No distress.  HENT: Head: Normocephalic and atraumatic. Ears: B TMs ok, no erythema or effusion; Nose: Nose normal. Mouth/Throat: Oropharynx is clear and moist. No oropharyngeal exudate.  Eyes: Conjunctivae and EOM are normal. Pupils are equal, round, and reactive to light. No scleral icterus.  Neck: Normal range of motion. Neck supple. No JVD present. No thyromegaly present.  Cardiovascular: Normal rate, regular rhythm and normal heart sounds.  No murmur heard. No BLE edema. Pulmonary/Chest: Effort normal and breath sounds normal. No respiratory distress. Abdominal: Soft. Bowel sounds are normal, no distension. There is no tenderness. no masses Breast: bilateral breast lumps  no nipple discharge or rashes FEMALE GENITALIA:  Not done  RECTAL: not done  Musculoskeletal: Normal range of motion, no joint effusions. No gross deformities Neurological: he is alert and oriented to person, place, and time. No cranial nerve deficit. Coordination, balance, strength, speech and gait are normal.  Skin: Skin is warm and dry. No rash noted. No erythema.  Psychiatric: Patient has a normal mood and affect. behavior is normal. Judgment and thought content normal.     Assessment & Plan  1. Well adult exam (Primary)  - Lipid panel - CBC with Differential/Platelet - Comprehensive metabolic panel with GFR - Hemoglobin  A1c - VITAMIN D  25 Hydroxy (Vit-D Deficiency, Fractures) -  TSH - Iron, TIBC and Ferritin Panel  2. Iron deficiency anemia due to chronic blood loss  - CBC with Differential/Platelet - Iron, TIBC and Ferritin Panel  3. Prediabetes  - Hemoglobin A1c  4. Hypothyroidism, unspecified type  - TSH  5. History of iron deficiency anemia  - CBC with Differential/Platelet - Iron, TIBC and Ferritin Panel  6. Lipid screening  - Lipid panel  7. Vitamin D  deficiency  - VITAMIN D  25 Hydroxy (Vit-D Deficiency, Fractures)  8. Breast cancer screening by mammogram  - MM 3D SCREENING MAMMOGRAM BILATERAL BREAST; Future   9. Breast lump in female  - MM 3D DIAGNOSTIC MAMMOGRAM BILATERAL BREAST; Future - US  LIMITED ULTRASOUND INCLUDING AXILLA LEFT BREAST ; Future - US  LIMITED ULTRASOUND INCLUDING AXILLA RIGHT BREAST; Future   -USPSTF grade A and B recommendations reviewed with patient; age-appropriate recommendations, preventive care, screening tests, etc discussed and encouraged; healthy living encouraged; see AVS for patient education given to patient -Discussed importance of 150 minutes of physical activity weekly, eat two servings of fish weekly, eat one serving of tree nuts ( cashews, pistachios, pecans, almonds.SABRA) every other day, eat 6 servings of fruit/vegetables daily and drink plenty of water and avoid sweet beverages.   -Reviewed Health Maintenance: Yes.       [1]  Current Outpatient Medications:    ascorbic acid (VITAMIN C) 500 MG tablet, Take 1,000 mg by mouth., Disp: , Rfl:    aspirin  81 MG chewable tablet, Chew 1 tablet (81 mg total) by mouth daily., Disp: , Rfl:    atorvastatin  (LIPITOR ) 10 MG tablet, Take 0.5 tablets (5 mg total) by mouth daily., Disp: 45 tablet, Rfl: 3   estradiol (VIVELLE-DOT) 0.1 MG/24HR patch, Place 1 patch onto the skin 2 (two) times a week., Disp: , Rfl:    ferrous sulfate  325 (65 FE) MG EC tablet, Take 325 mg by mouth., Disp: , Rfl:    Multiple  Vitamin (MULTIVITAMIN ADULT PO), Take 1 tablet by mouth daily., Disp: , Rfl:    nitroGLYCERIN  (NITROSTAT ) 0.4 MG SL tablet, Place 1 tablet (0.4 mg total) under the tongue every 5 (five) minutes x 3 doses as needed for chest pain., Disp: 25 tablet, Rfl: 0   progesterone (PROMETRIUM) 200 MG capsule, Take 200 mg by mouth at bedtime., Disp: , Rfl:    SYNTHROID  88 MCG tablet, Take 88 mcg by mouth., Disp: , Rfl:    SYNTHROID  100 MCG tablet, TAKE 1 TABLET BY MOUTH EVERY DAY BEFORE BREAKFAST (Patient not taking: Reported on 08/22/2024), Disp: 90 tablet, Rfl: 1   Vitamin D , Ergocalciferol , (DRISDOL) 50000 units CAPS capsule, Take 50,000 Units by mouth once a week. Wednesdays (Patient not taking: Reported on 08/22/2024), Disp: , Rfl: 1 [2]  Allergies Allergen Reactions   Fosamax [Alendronate] Other (See Comments)    stomachache   Latex Itching   Reclast [Zoledronic Acid] Rash

## 2024-08-23 LAB — CBC WITH DIFFERENTIAL/PLATELET
Absolute Lymphocytes: 1483 {cells}/uL (ref 850–3900)
Absolute Monocytes: 273 {cells}/uL (ref 200–950)
Basophils Absolute: 21 {cells}/uL (ref 0–200)
Basophils Relative: 0.5 %
Eosinophils Absolute: 29 {cells}/uL (ref 15–500)
Eosinophils Relative: 0.7 %
HCT: 40 % (ref 35.9–46.0)
Hemoglobin: 13.1 g/dL (ref 11.7–15.5)
MCH: 30.1 pg (ref 27.0–33.0)
MCHC: 32.8 g/dL (ref 31.6–35.4)
MCV: 92 fL (ref 81.4–101.7)
MPV: 12.2 fL (ref 7.5–12.5)
Monocytes Relative: 6.5 %
Neutro Abs: 2394 {cells}/uL (ref 1500–7800)
Neutrophils Relative %: 57 %
Platelets: 234 10*3/uL (ref 140–400)
RBC: 4.35 Million/uL (ref 3.80–5.10)
RDW: 11.9 % (ref 11.0–15.0)
Total Lymphocyte: 35.3 %
WBC: 4.2 10*3/uL (ref 3.8–10.8)

## 2024-08-23 LAB — COMPREHENSIVE METABOLIC PANEL WITH GFR
AG Ratio: 1.4 (calc) (ref 1.0–2.5)
ALT: 18 U/L (ref 6–29)
AST: 21 U/L (ref 10–35)
Albumin: 4.6 g/dL (ref 3.6–5.1)
Alkaline phosphatase (APISO): 79 U/L (ref 37–153)
BUN: 8 mg/dL (ref 7–25)
CO2: 28 mmol/L (ref 20–32)
Calcium: 9.7 mg/dL (ref 8.6–10.4)
Chloride: 102 mmol/L (ref 98–110)
Creat: 0.58 mg/dL (ref 0.50–1.03)
Globulin: 3.3 g/dL (ref 1.9–3.7)
Glucose, Bld: 90 mg/dL (ref 65–99)
Potassium: 3.9 mmol/L (ref 3.5–5.3)
Sodium: 138 mmol/L (ref 135–146)
Total Bilirubin: 0.5 mg/dL (ref 0.2–1.2)
Total Protein: 7.9 g/dL (ref 6.1–8.1)
eGFR: 110 mL/min/{1.73_m2}

## 2024-08-23 LAB — LIPID PANEL
Cholesterol: 145 mg/dL
HDL: 63 mg/dL
LDL Cholesterol (Calc): 69 mg/dL
Non-HDL Cholesterol (Calc): 82 mg/dL
Total CHOL/HDL Ratio: 2.3 (calc)
Triglycerides: 53 mg/dL

## 2024-08-23 LAB — HEMOGLOBIN A1C
Hgb A1c MFr Bld: 5.3 %
Mean Plasma Glucose: 105 mg/dL
eAG (mmol/L): 5.8 mmol/L

## 2024-08-23 LAB — IRON,TIBC AND FERRITIN PANEL
%SAT: 21 % (ref 16–45)
Ferritin: 20 ng/mL (ref 16–232)
Iron: 83 ug/dL (ref 45–160)
TIBC: 387 ug/dL (ref 250–450)

## 2024-08-23 LAB — VITAMIN D 25 HYDROXY (VIT D DEFICIENCY, FRACTURES): Vit D, 25-Hydroxy: 63 ng/mL (ref 30–100)

## 2024-08-23 LAB — TSH: TSH: 9.33 m[IU]/L — ABNORMAL HIGH

## 2024-08-25 ENCOUNTER — Ambulatory Visit: Payer: Self-pay | Admitting: Family Medicine

## 2024-09-01 ENCOUNTER — Other Ambulatory Visit

## 2024-09-01 ENCOUNTER — Inpatient Hospital Stay: Admission: RE | Admit: 2024-09-01 | Source: Ambulatory Visit

## 2024-09-08 ENCOUNTER — Other Ambulatory Visit

## 2024-09-08 ENCOUNTER — Inpatient Hospital Stay: Admission: RE | Admit: 2024-09-08

## 2024-09-16 ENCOUNTER — Other Ambulatory Visit

## 2025-03-09 ENCOUNTER — Ambulatory Visit: Admitting: Family Medicine
